# Patient Record
Sex: Female | Born: 1975
Health system: Southern US, Community
[De-identification: ages and names within clinical notes are randomized; demographics above are authoritative.]

## PROBLEM LIST (undated history)

## (undated) DIAGNOSIS — K219 Gastro-esophageal reflux disease without esophagitis: Secondary | ICD-10-CM

## (undated) DIAGNOSIS — R87629 Unspecified abnormal cytological findings in specimens from vagina: Secondary | ICD-10-CM

## (undated) DIAGNOSIS — Z8619 Personal history of other infectious and parasitic diseases: Secondary | ICD-10-CM

## (undated) HISTORY — PX: NO PAST SURGERIES: SHX2092

## (undated) HISTORY — DX: Unspecified abnormal cytological findings in specimens from vagina: R87.629

## (undated) HISTORY — DX: Gastro-esophageal reflux disease without esophagitis: K21.9

## (undated) HISTORY — DX: Personal history of other infectious and parasitic diseases: Z86.19

---

## 1999-07-03 ENCOUNTER — Other Ambulatory Visit: Admission: RE | Admit: 1999-07-03 | Discharge: 1999-07-03 | Payer: Self-pay | Admitting: *Deleted

## 2002-11-04 ENCOUNTER — Other Ambulatory Visit: Admission: RE | Admit: 2002-11-04 | Discharge: 2002-11-04 | Payer: Self-pay | Admitting: *Deleted

## 2004-04-03 ENCOUNTER — Other Ambulatory Visit: Admission: RE | Admit: 2004-04-03 | Discharge: 2004-04-03 | Payer: Self-pay | Admitting: *Deleted

## 2012-06-05 ENCOUNTER — Ambulatory Visit (INDEPENDENT_AMBULATORY_CARE_PROVIDER_SITE_OTHER): Payer: BC Managed Care – PPO | Admitting: Family Medicine

## 2012-06-05 VITALS — BP 130/80 | HR 84 | Temp 99.2°F | Resp 16 | Ht 64.0 in | Wt 160.2 lb

## 2012-06-05 DIAGNOSIS — J020 Streptococcal pharyngitis: Secondary | ICD-10-CM

## 2012-06-05 DIAGNOSIS — J029 Acute pharyngitis, unspecified: Secondary | ICD-10-CM

## 2012-06-05 MED ORDER — PENICILLIN V POTASSIUM 500 MG PO TABS: 500.0000 mg | ORAL_TABLET | Freq: Three times a day (TID) | ORAL | Status: AC

## 2012-06-05 NOTE — Progress Notes (Signed)
     Patient Name: Christina Charles Date of Birth: 19-May-1976 Medical Record Number: [id number] Gender: female Date of Encounter: 06/05/2012  History of Present Illness:  Christina Charles is a 36 y.o. very pleasant female patient who presents with the following:  She notes a ST since yesterday, pain with swallowing.  She had not noted a fever at home. She does not feel horrible, but bad enough that she wanted to come in for evaluation.  She is generally healthy.   LMP 05/15/12, is on OCP  No sick exposures but she does work at a nursing home.   No smoking history   There is no problem list on file for this patient.  No past medical history on file. No past surgical history on file. History  Substance Use Topics  . Smoking status: Never Smoker   . Smokeless tobacco: Not on file  . Alcohol Use: Not on file   No family history on file. No Known Allergies  Medication list has been reviewed and updated.  Prior to Admission medications   Medication Sig Start Date End Date Taking? Authorizing Provider  Ascorbic Acid (VITAMIN C) 100 MG tablet Take 100 mg by mouth daily.   Yes Historical Provider, MD  drospirenone-ethinyl estradiol (YASMIN,ZARAH,SYEDA) 3-0.03 MG tablet Take 1 tablet by mouth daily.   Yes Historical Provider, MD  Multiple Vitamins-Minerals (MULTIVITAMIN WITH MINERALS) tablet Take 1 tablet by mouth daily.   Yes Historical Provider, MD    Review of Systems:  As per HPI- otherwise negative.   Physical Examination: Filed Vitals:   06/05/12 1308  BP: 130/80  Pulse: 84  Temp: 99.2 F (37.3 C)  Resp: 16   Filed Vitals:   06/05/12 1308  Height: 5\' 4"  (1.626 m)  Weight: 160 lb 3.2 oz (72.666 kg)   Body mass index is 27.50 kg/(m^2). Ideal Body Weight: Weight in (lb) to have BMI = 25: 145.3   GEN: WDWN, NAD, Non-toxic, A & O x 3 HEENT: Atraumatic, Normocephalic. Neck supple. No masses, No LAD.  TM and oropharynx wnl, PEERL, EOMI Ears and Nose: No external  deformity. CV: RRR, No M/G/R. No JVD. No thrill. No extra heart sounds. PULM: CTA B, no wheezes, crackles, rhonchi. No retractions. No resp. distress. No accessory muscle use. EXTR: No c/c/e NEURO Normal gait.  PSYCH: Normally interactive. Conversant. Not depressed or anxious appearing.  Calm demeanor.   Results for orders placed in visit on 06/05/12  POCT RAPID STREP A (OFFICE)      Component Value Range   Rapid Strep A Screen Positive (*) Negative    Assessment and Plan: 1. Sore throat  POCT rapid strep A  2. Strep throat  penicillin v potassium (VEETID) 500 MG tablet   Treat for strep throat as above.  Fluids, rest, OTC pain medications as needed.  Patient (or parent if minor) instructed to return to clinic or call if not better in 2-3 day(s).  Sooner if worse.   No work for 24 hours at least   [employer], MD

## 2015-08-31 LAB — OB RESULTS CONSOLE RPR: RPR: NONREACTIVE

## 2015-08-31 LAB — OB RESULTS CONSOLE GC/CHLAMYDIA
Chlamydia: NEGATIVE
GC PROBE AMP, GENITAL: NEGATIVE

## 2015-08-31 LAB — OB RESULTS CONSOLE HIV ANTIBODY (ROUTINE TESTING): HIV: NONREACTIVE

## 2015-08-31 LAB — OB RESULTS CONSOLE ABO/RH: RH TYPE: POSITIVE

## 2015-08-31 LAB — OB RESULTS CONSOLE ANTIBODY SCREEN: Antibody Screen: NEGATIVE

## 2015-08-31 LAB — OB RESULTS CONSOLE HEPATITIS B SURFACE ANTIGEN: Hepatitis B Surface Ag: NEGATIVE

## 2015-08-31 LAB — OB RESULTS CONSOLE RUBELLA ANTIBODY, IGM: RUBELLA: IMMUNE

## 2015-10-09 ENCOUNTER — Other Ambulatory Visit (HOSPITAL_COMMUNITY): Payer: Self-pay | Admitting: Obstetrics

## 2015-10-09 DIAGNOSIS — Z3689 Encounter for other specified antenatal screening: Secondary | ICD-10-CM

## 2015-10-09 DIAGNOSIS — Z3A16 16 weeks gestation of pregnancy: Secondary | ICD-10-CM

## 2015-10-09 DIAGNOSIS — O283 Abnormal ultrasonic finding on antenatal screening of mother: Secondary | ICD-10-CM

## 2015-10-16 ENCOUNTER — Encounter (HOSPITAL_COMMUNITY): Payer: Self-pay

## 2015-10-16 ENCOUNTER — Other Ambulatory Visit (HOSPITAL_COMMUNITY): Payer: Self-pay | Admitting: Obstetrics

## 2015-10-16 ENCOUNTER — Ambulatory Visit (HOSPITAL_COMMUNITY)
Admission: RE | Admit: 2015-10-16 | Discharge: 2015-10-16 | Disposition: A | Discharge status: Home / Self Care | Payer: BLUE CROSS/BLUE SHIELD | Source: Ambulatory Visit | Attending: Obstetrics | Admitting: Obstetrics

## 2015-10-16 DIAGNOSIS — O09512 Supervision of elderly primigravida, second trimester: Secondary | ICD-10-CM | POA: Insufficient documentation

## 2015-10-16 DIAGNOSIS — Z3A16 16 weeks gestation of pregnancy: Secondary | ICD-10-CM | POA: Diagnosis not present

## 2015-10-16 DIAGNOSIS — Z3689 Encounter for other specified antenatal screening: Secondary | ICD-10-CM

## 2015-10-16 DIAGNOSIS — O283 Abnormal ultrasonic finding on antenatal screening of mother: Secondary | ICD-10-CM | POA: Insufficient documentation

## 2015-10-16 IMAGING — US US MFM OB TRANSVAGINAL
1 series · 14 of 28 positions shown · non-contrast
Comparison: none

[Series 1: us mfm ob transvaginal · 141 acquisitions, 14 frames shown]
[im 6/141]
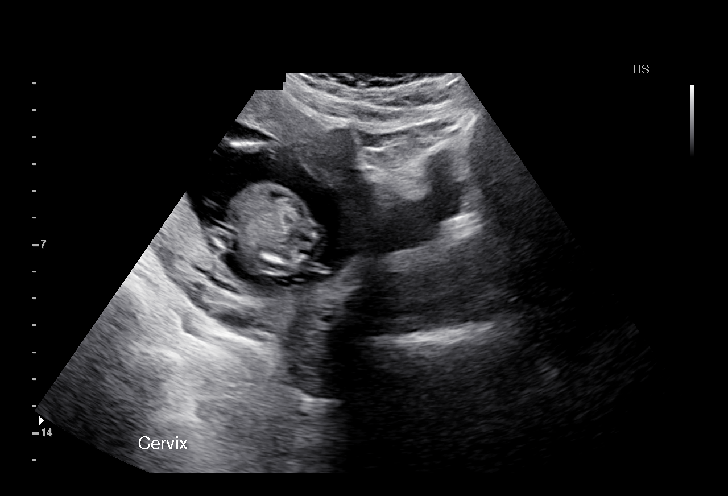
[im 16/141]
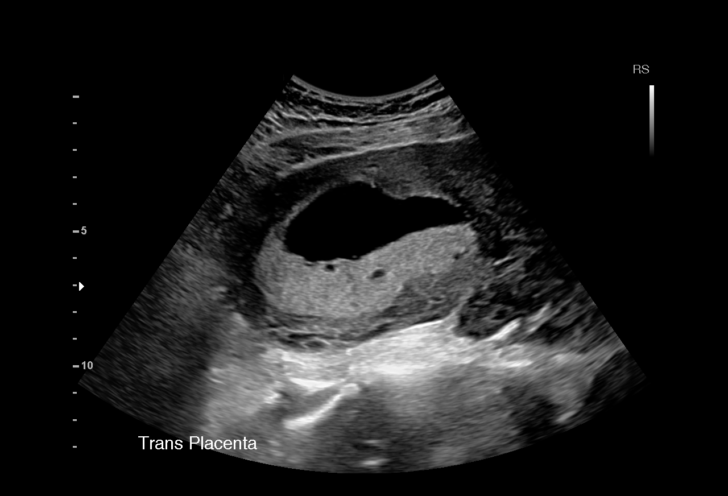
[im 26/141]
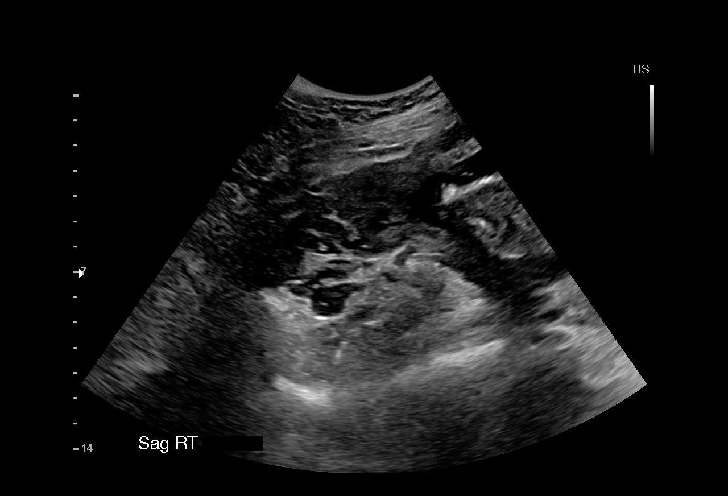
[im 37/141]
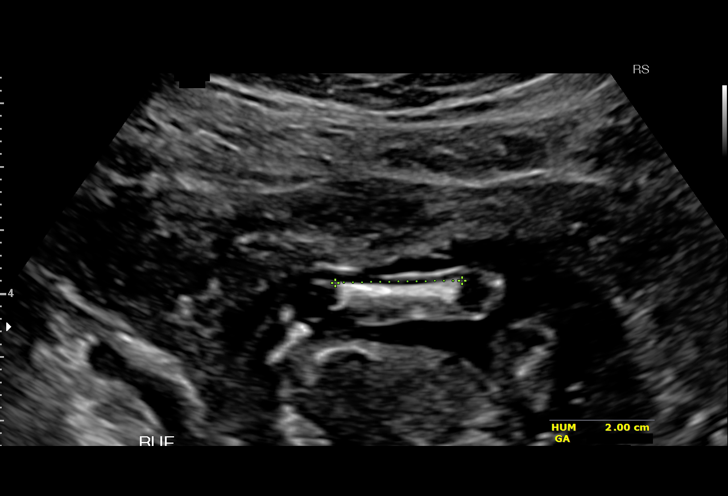
[im 47/141]
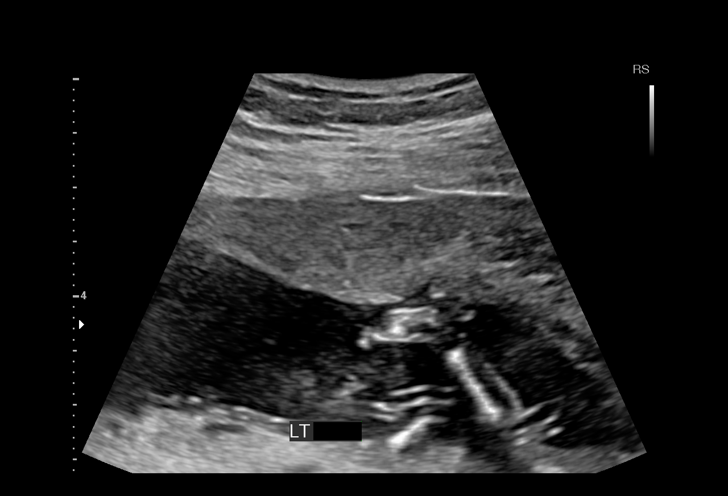
[im 58/141]
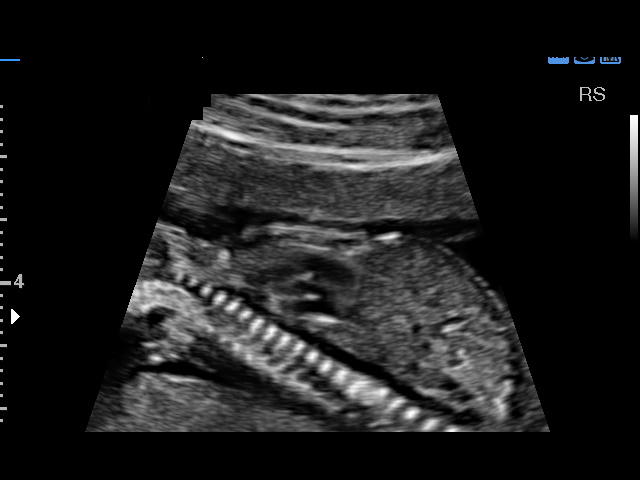
[im 68/141]
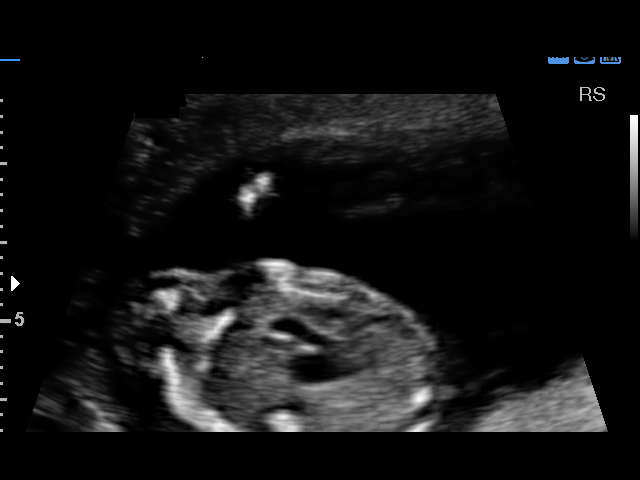
[im 78/141]
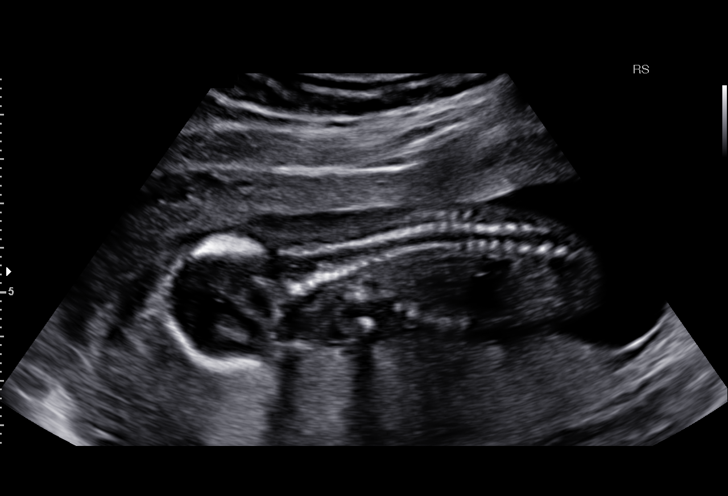
[im 89/141]
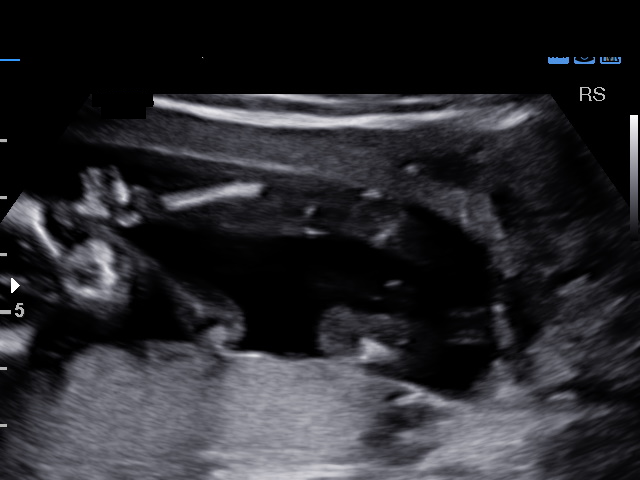
[im 99/141]
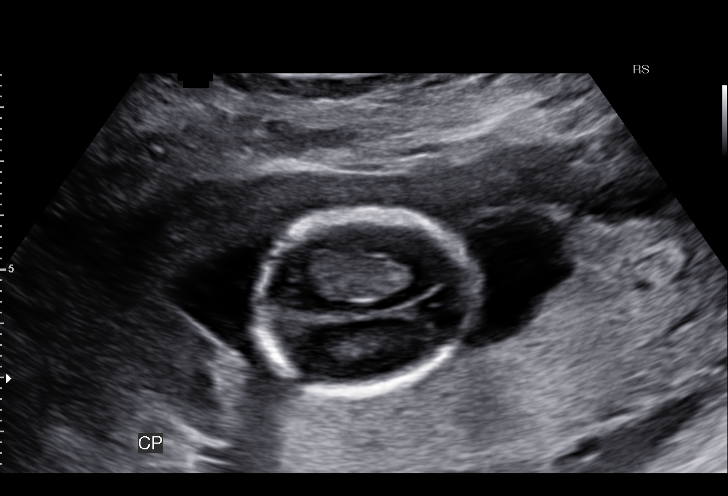
[im 109/141]
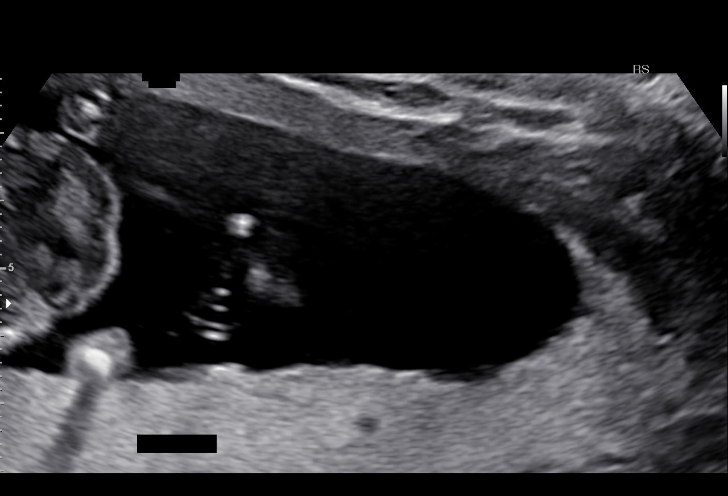
[im 120/141]
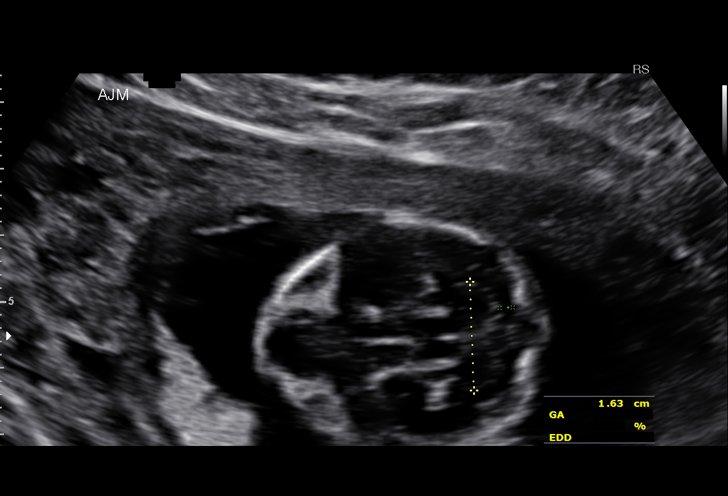
[im 130/141]
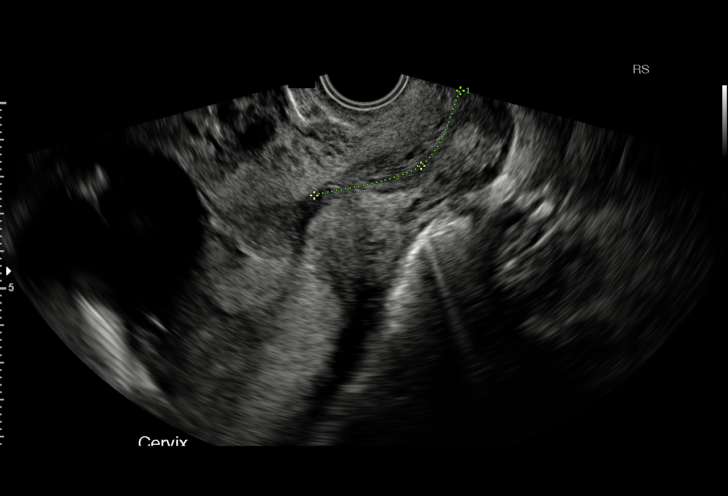
[im 141/141]
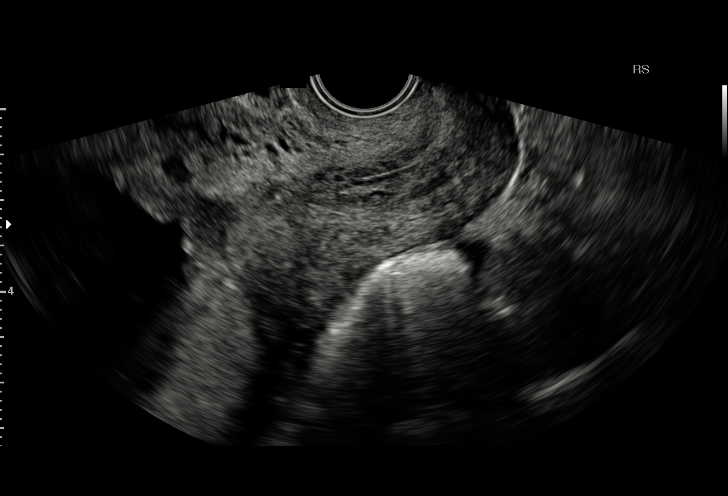

[14 of 28 positions shown; findings below may reference images not displayed]

OBSTETRICS REPORT
(Corrected Final [DATE] [DATE])

Service(s) Provided

US MFM OB TRANSVAGINAL                                76817.2
Indications

16 weeks gestation of pregnancy
Detailed fetal anatomic survey                        Z36
Advanced maternal age primigravida 35+, second        [6Q]
trimester
Abnormal fetal ultrasound, thickened NT on early      [6Q]
ultrasound
Placenta previa specified as without hemorrhage,      [6Q]
second trimester
Fetal Evaluation

Num Of Fetuses:    1
Fetal Heart Rate:  157                          bpm
Cardiac Activity:  Observed
Presentation:      Breech
Placenta:          Posterior Previa
P. Cord            Visualized
Insertion:

Amniotic Fluid
AFI FV:      Subjectively within normal limits
Larg Pckt:    3.74  cm
Biometry

BPD:     35.4  mm     G. Age:  16w 6d                CI:        81.35   70 - 86
FL/HC:      15.3   13.3 -
16.5
HC:     123.9  mm     G. Age:  16w 1d       27  %    HC/AC:      1.12   1.05 -
1.39
AC:     110.4  mm     G. Age:  16w 6d       66  %    FL/BPD:
FL:        19  mm     G. Age:  15w 4d       18  %    FL/AC:      17.2   20 - 24
HUM:     19.9  mm     G. Age:  15w 6d       39  %
CER:     16.4  mm     G. Age:  16w 3d       49  %
NFT:        3  mm

Est. FW:     152  gm      0 lb 5 oz     59  %
Gestational Age
LMP:           16w 3d        Date:  [DATE]                 EDD:   [DATE]
U/S Today:     16w 3d                                        EDD:   [DATE]
Best:          16w 3d     Det. By:  LMP  ([DATE])          EDD:   [DATE]
Anatomy

Cranium:          Appears normal         Aortic Arch:      Appears normal
Fetal Cavum:      Appears normal         Ductal Arch:      Appears normal
Ventricles:       Appears normal         Diaphragm:        Appears normal
Choroid Plexus:   Appears normal         Stomach:          Appears normal, left
sided
Cerebellum:       Appears normal         Abdomen:          Appears normal
Posterior Fossa:  Appears normal         Abdominal Wall:   Appears nml (cord
insert, abd wall)
Nuchal Fold:      Appears normal         Cord Vessels:     Appears normal (3
vessel cord)
Face:             Orbits appear          Kidneys:          Appear normal
normal
Lips:             Not well visualized    Bladder:          Appears normal
Heart:            Not well visualized    Spine:            Appears normal
RVOT:             Not well visualized    Lower             Appears normal
Extremities:
LVOT:             Appears normal         Upper             Appears normal
Extremities:

Other:  Fetus appears to be a female. Technically difficult due to fetal
position. Technically difficult due to early gestational age. Heels
visualized.
Targeted Anatomy

Fetal Central Nervous System
Lat. Ventricles:  6.3                    Cisterna Magna:
Cervix Uterus Adnexa

Cervical Length:    5.31     cm

Cervix:       Normal appearance by transvaginal scan
Uterus:       Multiple fibroids noted, see table below.
Left Ovary:    Not visualized.
Right Ovary:   Not visualized.

Adnexa:     No abnormality visualized. No adnexal mass visualized.
Myomas

Site                     L(cm)      W(cm)       D(cm)      Location
LUS
LUS

Blood Flow                  RI       PI       Comments

Impression

Single IUP at 16w 3d
Patient is referred due to thickened NT - had low risk
Panorama
Limited views of the fetal heart and face (lips) were obtained
due to earlyl gestational age and position
The remainder of the fetal anatomy appears normal
Lower uterine myomas noted as described above
Posterior placenta previa is noted
Normal amniotic fluid volume
Recommendations

Fetal echo scheduled due to thickened NT
Recommend follow up ultrasound in 4 weeks to complete
anatomy and reevaluate placental location

Attending Physician, POLANCO

## 2015-11-15 ENCOUNTER — Other Ambulatory Visit (HOSPITAL_COMMUNITY): Payer: Self-pay | Admitting: Maternal and Fetal Medicine

## 2015-11-15 ENCOUNTER — Ambulatory Visit (HOSPITAL_COMMUNITY)
Admission: RE | Admit: 2015-11-15 | Discharge: 2015-11-15 | Disposition: A | Discharge status: Home / Self Care | Payer: BLUE CROSS/BLUE SHIELD | Source: Ambulatory Visit | Attending: Obstetrics | Admitting: Obstetrics

## 2015-11-15 ENCOUNTER — Encounter (HOSPITAL_COMMUNITY): Payer: Self-pay

## 2015-11-15 DIAGNOSIS — D259 Leiomyoma of uterus, unspecified: Secondary | ICD-10-CM | POA: Insufficient documentation

## 2015-11-15 DIAGNOSIS — Z3A20 20 weeks gestation of pregnancy: Secondary | ICD-10-CM

## 2015-11-15 DIAGNOSIS — O4402 Placenta previa specified as without hemorrhage, second trimester: Secondary | ICD-10-CM

## 2015-11-15 DIAGNOSIS — O283 Abnormal ultrasonic finding on antenatal screening of mother: Secondary | ICD-10-CM | POA: Insufficient documentation

## 2015-11-15 DIAGNOSIS — O3412 Maternal care for benign tumor of corpus uteri, second trimester: Secondary | ICD-10-CM

## 2015-11-15 DIAGNOSIS — O09512 Supervision of elderly primigravida, second trimester: Secondary | ICD-10-CM

## 2015-11-15 DIAGNOSIS — Z36 Encounter for antenatal screening of mother: Secondary | ICD-10-CM | POA: Insufficient documentation

## 2015-11-17 ENCOUNTER — Encounter (HOSPITAL_COMMUNITY): Payer: Self-pay

## 2015-11-23 ENCOUNTER — Encounter (HOSPITAL_COMMUNITY): Payer: Self-pay

## 2015-12-17 NOTE — L&D Delivery Note (Signed)
Delivery Note At 7:36 PM a healthy female was delivered via Vaginal, Spontaneous Delivery (Presentation: ; Occiput Anterior).  APGAR: 9, 9; weight pending .   Placenta status: Intact, Pathology.  Cord: 3 vessels with the following complications: None.  Cord pH: N/A  Anesthesia: Epidural  Episiotomy: None Lacerations: 2nd degree;Perineal Suture Repair: 2.0 3.0 vicryl rapide Est. Blood Loss (mL):  100  Mom to postpartum.  Baby to Couplet care / Skin to Skin.  Shoshana Johal,MARIE-LYNE 03/31/2016, 8:13 PM

## 2016-03-15 LAB — OB RESULTS CONSOLE GBS: GBS: NEGATIVE

## 2016-03-27 ENCOUNTER — Other Ambulatory Visit: Payer: Self-pay | Admitting: Obstetrics

## 2016-03-27 ENCOUNTER — Telehealth (HOSPITAL_COMMUNITY): Payer: Self-pay | Admitting: *Deleted

## 2016-03-27 ENCOUNTER — Encounter (HOSPITAL_COMMUNITY): Payer: Self-pay | Admitting: *Deleted

## 2016-03-27 NOTE — Telephone Encounter (Signed)
Preadmission screen  

## 2016-03-31 ENCOUNTER — Encounter (HOSPITAL_COMMUNITY): Payer: Self-pay | Admitting: *Deleted

## 2016-03-31 ENCOUNTER — Inpatient Hospital Stay (HOSPITAL_COMMUNITY): Payer: Federal, State, Local not specified - PPO | Admitting: Anesthesiology

## 2016-03-31 ENCOUNTER — Inpatient Hospital Stay (HOSPITAL_COMMUNITY)
Admission: AD | Admit: 2016-03-31 | Discharge: 2016-04-02 | DRG: 775 | Disposition: A | Discharge status: Home / Self Care | Payer: Federal, State, Local not specified - PPO | Source: Ambulatory Visit | Attending: Obstetrics & Gynecology | Admitting: Obstetrics & Gynecology

## 2016-03-31 DIAGNOSIS — O4202 Full-term premature rupture of membranes, onset of labor within 24 hours of rupture: Secondary | ICD-10-CM | POA: Diagnosis present

## 2016-03-31 DIAGNOSIS — O9962 Diseases of the digestive system complicating childbirth: Secondary | ICD-10-CM | POA: Diagnosis present

## 2016-03-31 DIAGNOSIS — Z3A40 40 weeks gestation of pregnancy: Secondary | ICD-10-CM

## 2016-03-31 DIAGNOSIS — O3413 Maternal care for benign tumor of corpus uteri, third trimester: Secondary | ICD-10-CM | POA: Diagnosis present

## 2016-03-31 DIAGNOSIS — O9902 Anemia complicating childbirth: Secondary | ICD-10-CM | POA: Diagnosis present

## 2016-03-31 DIAGNOSIS — D649 Anemia, unspecified: Secondary | ICD-10-CM | POA: Diagnosis present

## 2016-03-31 DIAGNOSIS — O3663X0 Maternal care for excessive fetal growth, third trimester, not applicable or unspecified: Secondary | ICD-10-CM | POA: Diagnosis present

## 2016-03-31 DIAGNOSIS — K219 Gastro-esophageal reflux disease without esophagitis: Secondary | ICD-10-CM | POA: Diagnosis present

## 2016-03-31 DIAGNOSIS — D259 Leiomyoma of uterus, unspecified: Secondary | ICD-10-CM | POA: Diagnosis present

## 2016-03-31 LAB — ABO/RH: ABO/RH(D): AB POS

## 2016-03-31 LAB — CBC
HCT: 35.4 % — ABNORMAL LOW (ref 36.0–46.0)
Hemoglobin: 12.5 g/dL (ref 12.0–15.0)
MCH: 30.7 pg (ref 26.0–34.0)
MCHC: 35.3 g/dL (ref 30.0–36.0)
MCV: 87 fL (ref 78.0–100.0)
PLATELETS: 254 10*3/uL (ref 150–400)
RBC: 4.07 MIL/uL (ref 3.87–5.11)
RDW: 13.5 % (ref 11.5–15.5)
WBC: 11 10*3/uL — AB (ref 4.0–10.5)

## 2016-03-31 LAB — POCT FERN TEST: POCT Fern Test: POSITIVE

## 2016-03-31 LAB — TYPE AND SCREEN
ABO/RH(D): AB POS
Antibody Screen: NEGATIVE

## 2016-03-31 MED ORDER — PHENYLEPHRINE 40 MCG/ML (10ML) SYRINGE FOR IV PUSH (FOR BLOOD PRESSURE SUPPORT)
80.0000 ug | PREFILLED_SYRINGE | INTRAVENOUS | Status: DC | PRN
  Administered 2016-03-31: 80 ug via INTRAVENOUS
  Filled 2016-03-31: qty 20
  Filled 2016-03-31: qty 2

## 2016-03-31 MED ORDER — LACTATED RINGERS IV SOLN
INTRAVENOUS | Status: DC
  Administered 2016-03-31 (×2): via INTRAVENOUS

## 2016-03-31 MED ORDER — LIDOCAINE HCL (PF) 1 % IJ SOLN
INTRAMUSCULAR | Status: DC | PRN
  Administered 2016-03-31 (×2): 6 mL

## 2016-03-31 MED ORDER — FENTANYL 2.5 MCG/ML BUPIVACAINE 1/10 % EPIDURAL INFUSION (WH - ANES)
14.0000 mL/h | INTRAMUSCULAR | Status: DC | PRN
  Administered 2016-03-31: 14 mL/h via EPIDURAL
  Filled 2016-03-31: qty 125

## 2016-03-31 MED ORDER — DIPHENHYDRAMINE HCL 50 MG/ML IJ SOLN: 12.5000 mg | INTRAMUSCULAR | Status: DC | PRN

## 2016-03-31 MED ORDER — LACTATED RINGERS IV SOLN
500.0000 mL | INTRAVENOUS | Status: DC | PRN
  Administered 2016-03-31: 500 mL via INTRAVENOUS

## 2016-03-31 MED ORDER — CITRIC ACID-SODIUM CITRATE 334-500 MG/5ML PO SOLN: 30.0000 mL | ORAL | Status: DC | PRN

## 2016-03-31 MED ORDER — LACTATED RINGERS IV SOLN
1.0000 m[IU]/min | INTRAVENOUS | Status: DC
  Administered 2016-03-31: 2 m[IU]/min via INTRAVENOUS
  Filled 2016-03-31: qty 4

## 2016-03-31 MED ORDER — EPHEDRINE 5 MG/ML INJ
10.0000 mg | INTRAVENOUS | Status: DC | PRN
  Filled 2016-03-31: qty 2

## 2016-03-31 MED ORDER — TERBUTALINE SULFATE 1 MG/ML IJ SOLN
0.2500 mg | Freq: Once | INTRAMUSCULAR | Status: DC | PRN
  Filled 2016-03-31: qty 1

## 2016-03-31 MED ORDER — LACTATED RINGERS IV SOLN
500.0000 mL | Freq: Once | INTRAVENOUS | Status: DC
Start: 2016-03-31 — End: 2016-04-01

## 2016-03-31 MED ORDER — ONDANSETRON HCL 4 MG/2ML IJ SOLN: 4.0000 mg | Freq: Four times a day (QID) | INTRAMUSCULAR | Status: DC | PRN

## 2016-03-31 MED ORDER — OXYTOCIN BOLUS FROM INFUSION: 500.0000 mL | INTRAVENOUS | Status: DC

## 2016-03-31 MED ORDER — PHENYLEPHRINE 40 MCG/ML (10ML) SYRINGE FOR IV PUSH (FOR BLOOD PRESSURE SUPPORT)
80.0000 ug | PREFILLED_SYRINGE | INTRAVENOUS | Status: DC | PRN
  Filled 2016-03-31: qty 2

## 2016-03-31 MED ORDER — OXYCODONE-ACETAMINOPHEN 5-325 MG PO TABS: 2.0000 | ORAL_TABLET | ORAL | Status: DC | PRN

## 2016-03-31 MED ORDER — ACETAMINOPHEN 325 MG PO TABS
650.0000 mg | ORAL_TABLET | ORAL | Status: DC | PRN
  Administered 2016-03-31: 650 mg via ORAL
  Filled 2016-03-31: qty 2

## 2016-03-31 MED ORDER — LIDOCAINE HCL (PF) 1 % IJ SOLN
30.0000 mL | INTRAMUSCULAR | Status: DC | PRN
  Administered 2016-03-31: 30 mL via SUBCUTANEOUS
  Filled 2016-03-31: qty 30

## 2016-03-31 MED ORDER — OXYCODONE-ACETAMINOPHEN 5-325 MG PO TABS: 1.0000 | ORAL_TABLET | ORAL | Status: DC | PRN

## 2016-03-31 MED ORDER — OXYTOCIN 10 UNIT/ML IJ SOLN: 2.5000 [IU]/h | INTRAMUSCULAR | Status: DC

## 2016-03-31 NOTE — Anesthesia Procedure Notes (Signed)
Epidural Patient location during procedure: OB  Staffing Anesthesiologist: Franne Grip  Preanesthetic Checklist Completed: patient identified, site marked, surgical consent, pre-op evaluation, timeout performed, IV checked, risks and benefits discussed and monitors and equipment checked  Epidural Patient position: sitting Prep: DuraPrep Patient monitoring: heart rate and blood pressure Approach: midline Location: L3-L4 Injection technique: LOR air  Needle:  Needle type: Tuohy  Needle gauge: 17 G Needle insertion depth: 5 cm Catheter type: closed end flexible Catheter size: 19 Gauge Catheter at skin depth: 11 cm Test dose: negative and Other  Assessment Events: blood not aspirated, injection not painful, no injection resistance, negative IV test and no paresthesia  Additional Notes Reason for block:procedure for pain

## 2016-03-31 NOTE — Anesthesia Preprocedure Evaluation (Addendum)
Anesthesia Evaluation  Patient identified by MRN, date of birth, ID band Patient awake    Reviewed: Allergy & Precautions, NPO status , Patient's Chart, lab work & pertinent test results  Airway Mallampati: II  TM Distance: >3 FB Neck ROM: Full    Dental no notable dental hx.    Pulmonary neg pulmonary ROS,    Pulmonary exam normal breath sounds clear to auscultation       Cardiovascular negative cardio ROS Normal cardiovascular exam Rhythm:Regular Rate:Normal     Neuro/Psych negative neurological ROS  negative psych ROS   GI/Hepatic Neg liver ROS, GERD  Medicated,  Endo/Other  negative endocrine ROS  Renal/GU negative Renal ROS  negative genitourinary   Musculoskeletal negative musculoskeletal ROS (+)   Abdominal (+) + obese,   Peds negative pediatric ROS (+)  Hematology negative hematology ROS (+)   Anesthesia Other Findings   Reproductive/Obstetrics negative OB ROS                             Anesthesia Physical Anesthesia Plan  ASA: II  Anesthesia Plan: Epidural   Post-op Pain Management:    Induction: Intravenous  Airway Management Planned: Natural Airway  Additional Equipment:   Intra-op Plan:   Post-operative Plan:   Informed Consent: I have reviewed the patients History and Physical, chart, labs and discussed the procedure including the risks, benefits and alternatives for the proposed anesthesia with the patient or authorized representative who has indicated his/her understanding and acceptance.   Dental advisory given  Plan Discussed with: CRNA  Anesthesia Plan Comments: (Informed consent obtained prior to proceeding including risk of failure, 1% risk of PDPH, risk of minor discomfort and bruising.  Discussed rare but serious complications including epidural abscess, permanent nerve injury, epidural hematoma.  Discussed alternatives to epidural analgesia and  patient desires to proceed.  Timeout performed pre-procedure verifying patient name, procedure, and platelet count.  Patient tolerated procedure well. )        Anesthesia Quick Evaluation

## 2016-03-31 NOTE — Progress Notes (Signed)
Christina Charles is a 40 y.o. G1P0 at [redacted]w[redacted]d admitted for rupture of membranes, 2.5 cm at admission. Has fibroid uterus.  Objective: BP 104/59 mmHg  Pulse 100  Temp(Src) 98.4 F (36.9 C) (Oral)  Resp 20  Ht 5\' 4"  (1.626 m)  Wt 195 lb (88.451 kg)  BMI 33.46 kg/m2  SpO2 100%  LMP 06/23/2015   FHT:  FHR: 145 bpm, variability: moderate,  accelerations:  Present,  decelerations:  Absent UC:   regular, every 2-3 minutes SVE:   Complete/ 0 / LOT, in lateral position with peanut ball to rotate and descent.   Progressed well on pitocin, FHT in category I. Epidural working well. EFW 8.1/2 lbs.  Reassess descent every 30 min, RN to check at 5.30 pm and inform Dr Dellis Filbert.  Pt informed of call switch with Dr Dellis Filbert at 5 pm, understands and is ok.   Christina Charles R 03/31/2016, 5:14 PM

## 2016-03-31 NOTE — MAU Note (Addendum)
Leaking fld since 0530. Some mild contractions. Cont to leak fld since 0530. 3cm last sve

## 2016-03-31 NOTE — Progress Notes (Signed)
Arisbeth Daube is a 40 y.o. G1P0 at [redacted]w[redacted]d by ultrasound admitted for rupture of membranes, pitocin since admission at 9 am and progressing, anticipate active change now. S/p epidural.   Objective: BP 135/82 mmHg  Pulse 103  Temp(Src) 98.6 F (37 C) (Oral)  Resp 18  Ht 5\' 4"  (1.626 m)  Wt 195 lb (88.451 kg)  BMI 33.46 kg/m2  SpO2 100%  LMP 06/23/2015   FHT:  FHR: 145 bpm, variability: moderate,  accelerations:  Present,  decelerations:  Absent UC:   regular, every 2-3 minutes SVE:   Dilation: 9 Effacement (%): 100 Station: 0 Exam by:: patti moore rn  Assessment / Plan: Augmentation of labor, progressing well  Labor: Progressing normally Fetal Wellbeing:  Category I Pain Control:  Epidural I/D:  n/a Anticipated MOD:  NSVD  Sharese Manrique R 03/31/2016, 3:00 PM

## 2016-03-31 NOTE — H&P (Signed)
Christina Charles is a 40 y.o. female presenting at 40.2 wks in early labor with SROM at 5.30 am. Denies strong UCs or bleeding. P[facility]- Dr Pamala Hurry, from 1st trim. Abn NT at 14 wks, normal with MFM at 16 wks and at anatomy sono, normal Panorama and also normal fetal echo  Fibroid uterus, suspected LGA at 38 wk sono (8'5" 91% and AC 99%), GERD, Anemia.   History OB History    Gravida Para Term Preterm AB TAB SAB Ectopic Multiple Living   1              Past Medical History  Diagnosis Date  . Vaginal Pap smear, abnormal   . GERD (gastroesophageal reflux disease)   . Hx of varicella    Past Surgical History  Procedure Laterality Date  . No past surgeries     Family History: family history includes Cancer in her mother and paternal grandmother. Social History:  reports that she has never smoked. She has never used smokeless tobacco. She reports that she does not drink alcohol or use illicit drugs.   Prenatal Transfer Tool  Maternal Diabetes: No Genetic Screening: Normal Maternal Ultrasounds/Referrals: Abnormal:  Findings:   Other: Abn NT at 14 wks, normal with MFM at 16 wks and at anatomy sono, normal Panorama and also normal fetal echo   Fetal Ultrasounds or other Referrals:  Fetal echo- normal , MFM did Anatomy- normal  Maternal Substance Abuse:  No Significant Maternal Medications:  None Significant Maternal Lab Results:  Lab values include: Group B Strep negative Other Comments:  None  ROS Blood pressure 134/81, pulse 97, temperature 98.6 F (37 C), temperature source Oral, resp. rate 18, height 5\' 4"  (1.626 m), weight 195 lb (88.451 kg), last menstrual period 06/23/2015, SpO2 100 %. Exam Physical Exam   A&O x 3, no acute distress. Pleasant HEENT neg, no thyromegaly Lungs CTA bilat CV RRR, A1S2 normal Abdo soft, non tender, non acute Extr no edema/ tenderness Pelvic 2/50%/-3 per MAU RN, repeat check deferred since ROM.  FHT  140/ + accels/ no decels/ mod variab-  category I  Toco q 3-4 min, on pitocin since admission   Prenatal labs: ABO, Rh: --/--/AB POS, AB POS (04/16 0800) Antibody: NEG (04/16 0800) Rubella: Immune (09/15 0000) RPR: Nonreactive (09/15 0000)  HBsAg: Negative (09/15 0000)  HIV: Non-reactive (09/15 0000)  GBS: Negative (03/31 0000)    Assessment/Plan: 40 yo G1 at 40.2 wks, SROM and early labor. GBS(-). Fibroid uterus and suspect LGA at 8.1/2 -9 lbs. Watch progress, epidural ok. Reassess in active labor. Prepare for shoulder dystocia and also for pp h'hage due to fibroids.     Christina Charles R 03/31/2016, 2:47 PM

## 2016-04-01 ENCOUNTER — Encounter (HOSPITAL_COMMUNITY): Payer: Self-pay | Admitting: *Deleted

## 2016-04-01 LAB — CBC
HCT: 30.9 % — ABNORMAL LOW (ref 36.0–46.0)
Hemoglobin: 10.7 g/dL — ABNORMAL LOW (ref 12.0–15.0)
MCH: 30.4 pg (ref 26.0–34.0)
MCHC: 34.6 g/dL (ref 30.0–36.0)
MCV: 87.8 fL (ref 78.0–100.0)
PLATELETS: 221 10*3/uL (ref 150–400)
RBC: 3.52 MIL/uL — AB (ref 3.87–5.11)
RDW: 13.7 % (ref 11.5–15.5)
WBC: 13.3 10*3/uL — ABNORMAL HIGH (ref 4.0–10.5)

## 2016-04-01 LAB — RPR: RPR Ser Ql: NONREACTIVE

## 2016-04-01 MED ORDER — ONDANSETRON HCL 4 MG/2ML IJ SOLN: 4.0000 mg | INTRAMUSCULAR | Status: DC | PRN

## 2016-04-01 MED ORDER — TETANUS-DIPHTH-ACELL PERTUSSIS 5-2.5-18.5 LF-MCG/0.5 IM SUSP: 0.5000 mL | Freq: Once | INTRAMUSCULAR | Status: DC

## 2016-04-01 MED ORDER — IBUPROFEN 600 MG PO TABS
600.0000 mg | ORAL_TABLET | Freq: Four times a day (QID) | ORAL | Status: DC
  Administered 2016-04-01 – 2016-04-02 (×6): 600 mg via ORAL
  Filled 2016-04-01 (×7): qty 1

## 2016-04-01 MED ORDER — WITCH HAZEL-GLYCERIN EX PADS: 1.0000 "application " | MEDICATED_PAD | CUTANEOUS | Status: DC | PRN

## 2016-04-01 MED ORDER — ZOLPIDEM TARTRATE 5 MG PO TABS
5.0000 mg | ORAL_TABLET | Freq: Every evening | ORAL | Status: DC | PRN
Start: 2016-04-01 — End: 2016-04-01

## 2016-04-01 MED ORDER — DIBUCAINE 1 % RE OINT: 1.0000 "application " | TOPICAL_OINTMENT | RECTAL | Status: DC | PRN

## 2016-04-01 MED ORDER — FAMOTIDINE 20 MG PO TABS
20.0000 mg | ORAL_TABLET | Freq: Two times a day (BID) | ORAL | Status: DC
  Administered 2016-04-01 – 2016-04-02 (×3): 20 mg via ORAL
  Filled 2016-04-01 (×3): qty 1

## 2016-04-01 MED ORDER — OXYTOCIN 10 UNIT/ML IJ SOLN: 2.5000 [IU]/h | INTRAVENOUS | Status: DC | PRN

## 2016-04-01 MED ORDER — SENNOSIDES-DOCUSATE SODIUM 8.6-50 MG PO TABS
2.0000 | ORAL_TABLET | ORAL | Status: DC
  Administered 2016-04-01: 2 via ORAL
  Filled 2016-04-01 (×2): qty 2

## 2016-04-01 MED ORDER — KETOTIFEN FUMARATE 0.025 % OP SOLN: 1.0000 [drp] | Freq: Two times a day (BID) | OPHTHALMIC | Status: DC

## 2016-04-01 MED ORDER — BENZOCAINE-MENTHOL 20-0.5 % EX AERO
1.0000 "application " | INHALATION_SPRAY | CUTANEOUS | Status: DC | PRN
  Administered 2016-04-01: 1 via TOPICAL
  Filled 2016-04-01: qty 56

## 2016-04-01 MED ORDER — ACETAMINOPHEN 325 MG PO TABS
650.0000 mg | ORAL_TABLET | ORAL | Status: DC | PRN
  Administered 2016-04-01: 650 mg via ORAL
  Filled 2016-04-01: qty 2

## 2016-04-01 MED ORDER — SIMETHICONE 80 MG PO CHEW: 80.0000 mg | CHEWABLE_TABLET | ORAL | Status: DC | PRN

## 2016-04-01 MED ORDER — ONDANSETRON HCL 4 MG PO TABS: 4.0000 mg | ORAL_TABLET | ORAL | Status: DC | PRN

## 2016-04-01 MED ORDER — DIPHENHYDRAMINE HCL 25 MG PO CAPS: 25.0000 mg | ORAL_CAPSULE | Freq: Four times a day (QID) | ORAL | Status: DC | PRN

## 2016-04-01 MED ORDER — PRENATAL MULTIVITAMIN CH
1.0000 | ORAL_TABLET | Freq: Every day | ORAL | Status: DC
  Administered 2016-04-01 – 2016-04-02 (×2): 1 via ORAL
  Filled 2016-04-01 (×2): qty 1

## 2016-04-01 MED ORDER — COCONUT OIL OIL: 1.0000 "application " | TOPICAL_OIL | Status: DC | PRN

## 2016-04-01 NOTE — Lactation Note (Signed)
This note was copied from a baby's chart. Lactation Consultation Note New mom called to rm. D/t  Painful latches. Mom has large everted nipples w/purple around edges of nipples. Assisted in football position. Discussed positioning, body alignment. In football position. Discussed open flange and chin tug. Baby has tight upper labial frenulum. Able to protrude tongue past gum line. Latched w/baby w/o difficulty. Has good up mouth to obtain flange. Mom stated lightly tender but pain not as bad.  Patient Name: Christina Charles [id number] Date: 04/01/2016 Reason for consult: Follow-up assessment;Breast/nipple pain;Difficult latch   Maternal Data    Feeding Feeding Type: Breast Fed Length of feed: 35 min  LATCH Score/Interventions Latch: Repeated attempts needed to sustain latch, nipple held in mouth throughout feeding, stimulation needed to elicit sucking reflex. Intervention(s): Adjust position;Assist with latch;Breast compression;Breast massage  Audible Swallowing: A few with stimulation Intervention(s): Skin to skin;Hand expression  Type of Nipple: Everted at rest and after stimulation  Comfort (Breast/Nipple): Filling, red/small blisters or bruises, mild/mod discomfort  Problem noted: Mild/Moderate discomfort Interventions (Mild/moderate discomfort): Hand massage;Hand expression  Hold (Positioning): Assistance needed to correctly position infant at breast and maintain latch. Intervention(s): Breastfeeding basics reviewed;Support Pillows;Position options;Skin to skin  LATCH Score: 6  Lactation Tools Discussed/Used     Consult Status Consult Status: Follow-up Date: 04/01/16 (in pm) Follow-up type: In-patient    Theodoro Kalata 04/01/2016, 4:40 AM

## 2016-04-01 NOTE — Anesthesia Postprocedure Evaluation (Signed)
Anesthesia Post Note  Patient: Christina Charles  Procedure(s) Performed: * No procedures listed *  Patient location during evaluation: Mother Baby Anesthesia Type: Epidural Level of consciousness: awake and alert and oriented Pain management: pain level controlled Vital Signs Assessment: post-procedure vital signs reviewed and stable Respiratory status: respiratory function stable, nonlabored ventilation and spontaneous breathing Cardiovascular status: stable Postop Assessment: no headache, no backache, patient able to bend at knees, no signs of nausea or vomiting and adequate PO intake Anesthetic complications: no    Last Vitals:  Filed Vitals:   03/31/16 2220 03/31/16 2300  BP: 125/80 125/80  Pulse: 89 89  Temp: 37.4 C 37.4 C  Resp: 18 18    Last Pain:  Filed Vitals:   04/01/16 0019  PainSc: 4                  Vittoria Noreen

## 2016-04-01 NOTE — Progress Notes (Signed)
PPD 1 SVD with 2nd degree repair  S:  Reports feeling ok just sore              Tolerating po/ No nausea or vomiting             Bleeding is moderate             Pain controlled with motrin and tylenol             Up ad lib / ambulatory / voiding QS  Newborn breast feeding  O:               VS: BP 125/80 mmHg  Pulse 89  Temp(Src) 99.3 F (37.4 C) (Oral)  Resp 18  Ht 5\' 4"  (1.626 m)  Wt 88.451 kg (195 lb)  BMI 33.46 kg/m2  SpO2 100%  LMP 06/23/2015   LABS:              Recent Labs  03/31/16 0800 04/01/16 0516  WBC 11.0* 13.3*  HGB 12.5 10.7*  PLT 254 221               Blood type: --/--/AB POS, AB POS (04/16 0800)  Rubella: Immune (09/15 0000)                     I&O: Intake/Output      04/16 0701 - 04/17 0700 04/17 0701 - 04/18 0700   Urine (mL/kg/hr) 300 (0.1)    Blood 150 (0.1)    Total Output 450     Net -450                      Physical Exam:             Alert and oriented X3  Abdomen: soft, non-tender, non-distended              Fundus: firm, non-tender, U-1  Perineum: moderate edema / ice pack in place  Lochia: light  Extremities: no edema, no calf pain or tenderness    A: PPD # 1   Doing well - stable status  P: Routine post partum orders  DC tomorrow  Artelia Laroche CNM, MSN, [facility] 04/01/2016, 7:53 AM

## 2016-04-01 NOTE — Lactation Note (Signed)
This note was copied from a baby's chart. Lactation Consultation Note  Follow up visit made.  Mom c/o sore nipples.  Baby is currently latched in football hold and latch appears deep with lips flanged but mom uncomfortable.  Baby taken off breast and small abrasions noted on tip of nipple and nipple slightly pinched.  Mom has large, erect nipples.  Assisted mom with side lying position.  Baby opened wide and easily latched deeply to breast.  Mom states this is much more comfortable.  Instructed on breast massage during feeding.  Comfort gels given with instructions.  Encouraged to call with concerns/assist. Prn.  Patient Name: Christina Charles [id number] Date: 04/01/2016 Reason for consult: Follow-up assessment;Breast/nipple pain   Maternal Data    Feeding Feeding Type: Breast Fed Length of feed: 15 min  LATCH Score/Interventions Latch: Grasps breast easily, tongue down, lips flanged, rhythmical sucking. Intervention(s): Adjust position;Assist with latch;Breast massage;Breast compression  Audible Swallowing: A few with stimulation Intervention(s): Skin to skin Intervention(s): Alternate breast massage;Hand expression  Type of Nipple: Everted at rest and after stimulation  Comfort (Breast/Nipple): Filling, red/small blisters or bruises, mild/mod discomfort  Problem noted: Mild/Moderate discomfort Interventions (Mild/moderate discomfort): Comfort gels  Hold (Positioning): Assistance needed to correctly position infant at breast and maintain latch. Intervention(s): Support Pillows;Position options;Skin to skin  LATCH Score: 7  Lactation Tools Discussed/Used     Consult Status Consult Status: Follow-up Date: 04/02/16 Follow-up type: In-patient    [name] 04/01/2016, 8:45 AM

## 2016-04-02 MED ORDER — ACETAMINOPHEN 500 MG PO TABS: 500.0000 mg | ORAL_TABLET | Freq: Four times a day (QID) | ORAL | Status: DC | PRN

## 2016-04-02 MED ORDER — IBUPROFEN 200 MG PO TABS: 600.0000 mg | ORAL_TABLET | Freq: Four times a day (QID) | ORAL | Status: DC | PRN

## 2016-04-02 NOTE — Lactation Note (Signed)
This note was copied from a baby's chart. Lactation Consultation Note; Mother states that her nipples feel much better today. She is using the comfort gels.Mother is not using a nipple shield.. She is latching infant to the bare breast.  Mother had just finished a 30 mins feeding. She was attempting to latch infant on the alternate breast when I arrived in the room. Mother advised in treatment to prevent engorgement . She was also informed to drain breast well if become engorged. Mother is aware of available [place] services and community support.   Patient Name: Christina Charles [id number] Date: 04/02/2016 Reason for consult: Follow-up assessment   Maternal Data    Feeding Feeding Type: Breast Fed Length of feed: 30 min (per mom)  LATCH Score/Interventions                      Lactation Tools Discussed/Used     Consult Status Consult Status: Complete    Darla Lesches 04/02/2016, 10:39 AM

## 2016-04-02 NOTE — Discharge Summary (Signed)
OB Discharge Summary     Patient Name: Christina Charles DOB: 06/16/1976 MRN: [id number]  Date of admission: 03/31/2016 Delivering MD: Princess Bruins   Date of discharge: 04/12/2016  Admitting diagnosis: 40WKS WATER BROKE  Intrauterine pregnancy: [redacted]w[redacted]d     Secondary diagnosis:  Principal Problem:   Postpartum care following vaginal delivery (4/16) Active Problems:   SVD (spontaneous vaginal delivery)  Additional problems: none     Discharge diagnosis: Term Pregnancy Delivered                                                                                                Post partum procedures:none  Augmentation: Pitocin  Complications: None  Hospital course:  Onset of Labor With Vaginal Delivery     40 y.o. yo G1P1001 at [redacted]w[redacted]d was admitted in Latent Labor on 03/31/2016. Patient had an uncomplicated labor course as follows:  Membrane Rupture Time/Date: 5:30 AM ,03/31/2016   Intrapartum Procedures: Episiotomy: None [1]                                         Lacerations:  2nd degree [3];Perineal [11]  Patient had a delivery of a Viable infant. 03/31/2016  Information for the patient's newborn:  Kandee, Stetzel [id number]  Delivery Method: Vaginal, Spontaneous Delivery (Filed from Delivery Summary)    Pateint had an uncomplicated postpartum course.  She is ambulating, tolerating a regular diet, passing flatus, and urinating well. Patient is discharged home in stable condition on 04/12/2016.    Physical exam  Filed Vitals:   03/31/16 2300 04/01/16 0815 04/01/16 1821 04/02/16 0534  BP: 125/80 111/74 119/77 120/73  Pulse: 89 82 79 83  Temp: 99.3 F (37.4 C) 97.9 F (36.6 C) 98.2 F (36.8 C) 98 F (36.7 C)  TempSrc: Oral Oral Oral Oral  Resp: 18 20    Height:      Weight:      SpO2:  98%  99%   General: alert, cooperative and no distress Lochia: appropriate Uterine Fundus: firm DVT Evaluation: No evidence of DVT seen on physical exam. Negative Homan's  sign. No cords or calf tenderness. No significant calf/ankle edema. Labs: Lab Results  Component Value Date   WBC 13.3* 04/01/2016   HGB 10.7* 04/01/2016   HCT 30.9* 04/01/2016   MCV 87.8 04/01/2016   PLT 221 04/01/2016   No flowsheet data found.  Discharge instruction: per After Visit Summary and "Baby and Me Booklet".  After visit meds:    Medication List    TAKE these medications        acetaminophen 500 MG tablet  Commonly known as:  TYLENOL  Take 1 tablet (500 mg total) by mouth every 6 (six) hours as needed.     ibuprofen 200 MG tablet  Commonly known as:  ADVIL  Take 3 tablets (600 mg total) by mouth every 6 (six) hours as needed.     ketotifen 0.025 % ophthalmic solution  Commonly known as:  ZADITOR  Place 1  drop into both eyes 2 (two) times daily.     PRENATAL VITAMIN PO  Take 1 tablet by mouth daily.     ranitidine 150 MG tablet  Commonly known as:  ZANTAC  Take 150 mg by mouth 2 (two) times daily.        Diet: routine diet  Activity: Advance as tolerated. Pelvic rest for 6 weeks.   Outpatient follow up:6 weeks Follow up Appt:No future appointments. Follow up Visit:No Follow-up on file.  Postpartum contraception: Undecided  Newborn Data: Live born female on 03/31/2016 Birth Weight: 8 lb 1.6 oz (3674 g) APGAR: 9, 9  Baby Feeding: Breast Disposition:home with mother   04/12/2016 Laury Deep, Jerilynn Mages, CNM

## 2016-04-02 NOTE — Progress Notes (Signed)
PPD 2 SVD with 2nd degree repair, GIRL.   S:  Reports feeling ok just sore              Tolerating po/ No nausea or vomiting             Bleeding is moderate             Pain controlled with motrin and tylenol             Up ad lib / ambulatory / voiding QS  Newborn breast feeding  O:               VS: BP 120/73 mmHg  Pulse 83  Temp(Src) 98 F (36.7 C) (Oral)  Resp 20  Ht 5\' 4"  (1.626 m)  Wt 195 lb (88.451 kg)  BMI 33.46 kg/m2  SpO2 99%  LMP 06/23/2015  Breastfeeding? Unknown   LABS:               Recent Labs  03/31/16 0800 04/01/16 0516  WBC 11.0* 13.3*  HGB 12.5 10.7*  PLT 254 221               Blood type: --/--/AB POS, AB POS (04/16 0800)  Rubella: Immune (09/15 0000)                     I&O: Intake/Output      04/17 0701 - 04/18 0700 04/18 0701 - 04/19 0700   Urine (mL/kg/hr)     Blood     Total Output       Net                        Physical Exam:             Alert and oriented X3  Abdomen: soft, non-tender, non-distended              Fundus: firm, non-tender, U-1  Perineum: moderate edema / ice pack in place  Lochia: light  Extremities: no edema, no calf pain or tenderness    A: PPD # 2   Doing well - stable status  P: Routine post partum orders  DC today  Amorina Doerr R   04/02/2016, 11:48 AM

## 2016-04-02 NOTE — Discharge Instructions (Signed)

## 2016-04-03 ENCOUNTER — Inpatient Hospital Stay (HOSPITAL_COMMUNITY): Admission: RE | Admit: 2016-04-03 | Payer: BLUE CROSS/BLUE SHIELD | Source: Ambulatory Visit

## 2016-04-03 ENCOUNTER — Inpatient Hospital Stay (HOSPITAL_COMMUNITY): Payer: BLUE CROSS/BLUE SHIELD

## 2016-04-04 ENCOUNTER — Telehealth (HOSPITAL_COMMUNITY): Payer: Self-pay | Admitting: Lactation Services

## 2016-04-04 ENCOUNTER — Ambulatory Visit (HOSPITAL_COMMUNITY)
Admission: RE | Admit: 2016-04-04 | Discharge: 2016-04-04 | Disposition: A | Discharge status: Home / Self Care | Payer: Federal, State, Local not specified - PPO | Source: Ambulatory Visit | Attending: Obstetrics | Admitting: Obstetrics

## 2016-04-04 NOTE — Lactation Note (Signed)
Lactation Consult  Mother's reason for visit:  Painful and difficult latch Visit Type: Outpatient Appointment Notes:   Consult:  Initial Lactation Consultant:  Broadus John  ________________________________________________________________________ Christina Charles Name: Christina Charles Date of Birth: 03/31/2014 Pediatrician: Maisie Fus Gender: female Gestational Age: [redacted]w[redacted]d (At Birth) Birth Weight: 8 lb 1.6 oz (3674 g) Weight at Discharge: Weight: 7 lb 12.9 oz ([id number] g)Date of Discharge: 04/02/2016 Filed Weights   03/31/16 1936 04/01/16 2353  Weight: 8 lb 1.6 oz (3674 g) 7 lb 12.9 oz (3540 g)   Last weight taken from location outside of [facility]: 7 lb 8 oz (ped)   Weight today: 7 lbs 10.6 oz (3478 g)   Due to weight loss of 11% yesterday at Pediatrician office, and very sore nipples, baby appeared dehydrated, with only 2 stools since birth. Mom comes in seeking assistance.  Breasts are full (not engorged), and nipples red and raw on tips.  Assistance given with latching baby on deeply.  Baby latched deeply, but tucks in lower lip, so showed Mom and FOB how to untuck lip.  Baby appeared nutritive, with deep jaw extensions, and swallowing audible.  Taught how to do alternate breast compression during feeding. Mom stated the discomfort was much improved.  After 25 mins on right breast, nipple pulled out and rounded.  Milk transfer only 8 ml.  Switched to cross cradle hold, and Mom latched baby on with instruction on hand placement for support.  Baby appeared more nutritive and latch felt comfortable.  Milk transfer better at 30 ml.  While FOB fed baby supplement, assisted Mom to pump using her pump.  50 ml of EBM obtained after 20 mins.  The flange of her Purely Yours pump appears tight, and nipple rubs inside.  Mom states pumping isn't comfortable.  She started pumping after Ped appointment yesterday.  She does have a friend's Medela PIS to use, bought tubing and  given 30 mm flanges for a more comfortable fit.  Recommended she call her OB about obtaining rx for APNO.   Plan- Breast feed often on cue, skin to skin.  Place baby skin to skin at 3 hrs if she is sleeping Offer 30 ml expressed breast milk by bottle using the "Pace Method" Pump both breasts 15-20 mins (massage and manual breast expression) Follow up Monday, 04/08/16 @ 12 n  ________________________________________________________________________  Mother's Name: Christina Charles Type of delivery:  vaginal Breastfeeding Experience:   Maternal Medical Conditions:  Polycystic ovarian syndrome fibroids Maternal Medications:  PNV , stool softener  ________________________________________________________________________  Breastfeeding History (Post Discharge)  Frequency of breastfeeding: every 2-3 hrs Duration of feeding: 10-20 with supplementation  Supplementation  Formula:  Volume 15 increasing to 30 ml Frequency:  Every 2-3 hrs after BF Total volume per day:  300 ml       Brand: Similac  Breastmilk:  Volume 5 ml Frequency:  Every 2-3 hrs Total volume per day:  50 ml  Method:  Bottle,   Pumping  Type of pump:  Ameda Frequency:  Every 2-3 hrs Volume:  3-5 ml  Infant Intake and Output Assessment  Voids:  6-8 in 24 hrs.  Color:  Clear yellow Stools:  1 in 24 hrs.  Color:  Ow[place] Shark (only 2 stools since birth)  ________________________________________________________________________  Maternal Breast Assessment  Breast:  Full Nipple:  Erect, Reddened and Bleeding Pain level:  4 Pain interventions:  Bra, Comfort gels and Expressed breast milk  _______________________________________________________________________ Feeding Assessment/Evaluation  Initial feeding assessment:  Infant's oral assessment:  WNL  Positioning:  Football Right breast  LATCH documentation:  Latch:  1 = Repeated attempts needed to sustain latch, nipple held in mouth throughout feeding,  stimulation needed to elicit sucking reflex.  Audible swallowing:  2 = Spontaneous and intermittent  Type of nipple:  2 = Everted at rest and after stimulation  Comfort (Breast/Nipple):  1 = Filling, red/small blisters or bruises, mild/mod discomfort  Hold (Positioning):  1 = Assistance needed to correctly position infant at breast and maintain latch  LATCH score:  7  Attached assessment:  Deep  Lips flanged:  No.  Lips untucked:  Yes.    Suck assessment:  Displays both  Tools:  Pump, Comfort gels and Bottle Instructed on use and cleaning of tool:  Yes.    Pre-feed weight:  3478 g  Post-feed weight:  3486 g Amount transferred:  8 ml   Additional Feeding Assessment -   Positioning:  Cross cradle Left breast  LATCH documentation:  Latch:  2 = Grasps breast easily, tongue down, lips flanged, rhythmical sucking.  Audible swallowing:  2 = Spontaneous and intermittent  Type of nipple:  2 = Everted at rest and after stimulation  Comfort (Breast/Nipple):  1 = Filling, red/small blisters or bruises, mild/mod discomfort  Hold (Positioning):  1 = Assistance needed to correctly position infant at breast and maintain latch  LATCH score:  8  Attached assessment:  Deep  Lips flanged:  No.  Lips untucked:  Yes.    Suck assessment:  Nutritive  Tools:  Pump, Comfort gels and Bottle Instructed on use and cleaning of tool:  Yes.    Pre-feed weight:  3486 g   Post-feed weight:  3516 g Amount transferred: 30 ml Amount supplemented:  30 ml   Total amount pumped post feed:  R 20 ml    L 30 ml  Using her Ameda Purely Yours pump.  Total amount transferred:  38 ml Total supplement given:  30 ml

## 2016-04-04 NOTE — Telephone Encounter (Signed)
[facility] returned telephone call r/e question mom has after [facility] appointment this morning.   Mom stated she has pumped 3x since OP appointment today and is getting 20-30 ml at home.   Mom could not remember the order of ice or warm compress with pumping.  LC explained to mom rationale for ice prior to pumping and warm compress with pumping to increase milk flow. Encouraged mom to wait 30 min after feeding before pumping breast. Mom is latching with each feeding and post-feeding after latches ~ 30 ml.   Follow-up with [facility] in OP Lactation appointment on Monday, April 24, 12pm

## 2016-04-06 ENCOUNTER — Telehealth (HOSPITAL_COMMUNITY): Payer: Self-pay | Admitting: Lactation Services

## 2016-04-06 NOTE — Telephone Encounter (Signed)
Patient called to confirm [facility] O/P appt. For Monday 4/24 at 12N , [facility] called back and left message to confirm appt.

## 2016-04-08 ENCOUNTER — Ambulatory Visit (HOSPITAL_COMMUNITY)
Admission: RE | Admit: 2016-04-08 | Discharge: 2016-04-08 | Disposition: A | Discharge status: Home / Self Care | Payer: BLUE CROSS/BLUE SHIELD | Source: Ambulatory Visit | Attending: Obstetrics | Admitting: Obstetrics

## 2016-04-08 NOTE — Lactation Note (Signed)
Lactation Consult  Mother's reason for visit: Follow-up for weight. Mom's goal is to give BF one last try before changes to exclusive breastmilk in a bottle Visit Type:  OP Appointment Notes:  Christina Charles has been exclusively feeding from a bottle. Mom reports that after her consult last week she successfully BF twice but when her milk came to volume baby would not latch.  Today it was noted that she does not flange her upper lip well. She also did not pull a gloved finger deeply into her mouth.  There may be an oral restriction posteriorly as some tongue humping was noted. Lateralization to the right was good but she did not lateralize to the left. Encouraged tongue exercises at home to help with tongue ROM.  Placed Christina Charles in a prone position and assisted her with neck ROM. This helped her to open her mouth wider, It was noted that when she was on her tummy and her head was turned to the left her right hip up lifted a bit. Encouraged parents to do tummy time at home a few times a day. Assisted mom to position baby in a football hold. Baby was latched using an off-center latch. Initially she just held the nipple in her mouth and needed stimulation to start suckling. Once she began, intermittent stimulation was needed for suckling to continue.  She did not fatigue during the feeding and with breast compression many swallows were observed. She transferred 1.7 oz on the right breast and then ate on the left for a total intake of 2.5 oz. Biggest concern is that stimulation was needed to initiated suckling.  Mom  Post-pump 60 ml of hind milk. Plan is to work on BF and exercises at home to see if there is improvement. Encouraged support group and smart start for weight checks.  Mom will call if she desires another OP appointment.  She may decided to feed BM in a bottle.  Consult:  Follow-Up Lactation Consultant:  Van Clines  ________________________________________________________________________ Christina Charles  Name: Christina Charles Date of Birth: 03/31/2014 Pediatrician: Maisie Fus Gender: female Gestational Age: [redacted]w[redacted]d (At Birth) Birth Weight: 8 lb 1.6 oz (3674 g) Weight at Discharge: Weight: 7 lb 12.9 oz (3540 g)Date of Discharge: 04/02/2016 [facility] Weights   03/31/16 1936 04/01/16 2353  Weight: 8 lb 1.6 oz (3674 g) 7 lb 12.9 oz (3540 g)   Weight today: 7#13 oz      ________________________________________________________________________  Mother's Name: Christina Charles Type of delivery:  Vagianl  ________________________________________________________________________      Output Assessment  Voids:  7-8 in 24 hrs.  Color:  Clear yellow Stools:  3-4 in 24 hrs.  Color:  Yellow  ________________________________________________________________________  Maternal Breast Assessment  Breast:  Full Nipple:  Erect and looked bruised even before baby latched Pain level:  0 Pain interventions:  All purpose nipple cream  _______________________________________________________________________

## 2016-12-17 DIAGNOSIS — L988 Other specified disorders of the skin and subcutaneous tissue: Secondary | ICD-10-CM | POA: Diagnosis not present

## 2016-12-17 DIAGNOSIS — D485 Neoplasm of uncertain behavior of skin: Secondary | ICD-10-CM | POA: Diagnosis not present

## 2017-02-11 DIAGNOSIS — K080 Exfoliation of teeth due to systemic causes: Secondary | ICD-10-CM | POA: Diagnosis not present

## 2017-04-15 DIAGNOSIS — Z30432 Encounter for removal of intrauterine contraceptive device: Secondary | ICD-10-CM | POA: Diagnosis not present

## 2017-08-14 DIAGNOSIS — K080 Exfoliation of teeth due to systemic causes: Secondary | ICD-10-CM | POA: Diagnosis not present

## 2017-08-27 DIAGNOSIS — Z3201 Encounter for pregnancy test, result positive: Secondary | ICD-10-CM | POA: Diagnosis not present

## 2017-09-02 DIAGNOSIS — H01111 Allergic dermatitis of right upper eyelid: Secondary | ICD-10-CM | POA: Diagnosis not present

## 2017-09-02 DIAGNOSIS — H01115 Allergic dermatitis of left lower eyelid: Secondary | ICD-10-CM | POA: Diagnosis not present

## 2017-09-02 DIAGNOSIS — H01114 Allergic dermatitis of left upper eyelid: Secondary | ICD-10-CM | POA: Diagnosis not present

## 2017-09-02 DIAGNOSIS — H01112 Allergic dermatitis of right lower eyelid: Secondary | ICD-10-CM | POA: Diagnosis not present

## 2017-09-19 DIAGNOSIS — Z3201 Encounter for pregnancy test, result positive: Secondary | ICD-10-CM | POA: Diagnosis not present

## 2017-09-19 DIAGNOSIS — Z23 Encounter for immunization: Secondary | ICD-10-CM | POA: Diagnosis not present

## 2017-09-30 DIAGNOSIS — Z3A10 10 weeks gestation of pregnancy: Secondary | ICD-10-CM | POA: Diagnosis not present

## 2017-09-30 DIAGNOSIS — Z118 Encounter for screening for other infectious and parasitic diseases: Secondary | ICD-10-CM | POA: Diagnosis not present

## 2017-09-30 DIAGNOSIS — O09521 Supervision of elderly multigravida, first trimester: Secondary | ICD-10-CM | POA: Diagnosis not present

## 2017-09-30 DIAGNOSIS — Z3689 Encounter for other specified antenatal screening: Secondary | ICD-10-CM | POA: Diagnosis not present

## 2017-09-30 LAB — OB RESULTS CONSOLE RUBELLA ANTIBODY, IGM: RUBELLA: NON-IMMUNE/NOT IMMUNE

## 2017-09-30 LAB — OB RESULTS CONSOLE HEPATITIS B SURFACE ANTIGEN: Hepatitis B Surface Ag: NEGATIVE

## 2017-09-30 LAB — OB RESULTS CONSOLE ANTIBODY SCREEN: Antibody Screen: NEGATIVE

## 2017-09-30 LAB — OB RESULTS CONSOLE ABO/RH: RH Type: POSITIVE

## 2017-09-30 LAB — OB RESULTS CONSOLE GC/CHLAMYDIA
CHLAMYDIA, DNA PROBE: NEGATIVE
GC PROBE AMP, GENITAL: NEGATIVE

## 2017-09-30 LAB — OB RESULTS CONSOLE RPR: RPR: NONREACTIVE

## 2017-09-30 LAB — OB RESULTS CONSOLE HIV ANTIBODY (ROUTINE TESTING): HIV: NONREACTIVE

## 2017-10-29 DIAGNOSIS — Z3A14 14 weeks gestation of pregnancy: Secondary | ICD-10-CM | POA: Diagnosis not present

## 2017-10-29 DIAGNOSIS — O99820 Streptococcus B carrier state complicating pregnancy: Secondary | ICD-10-CM | POA: Diagnosis not present

## 2017-10-29 DIAGNOSIS — Z3689 Encounter for other specified antenatal screening: Secondary | ICD-10-CM | POA: Diagnosis not present

## 2017-10-29 DIAGNOSIS — O09522 Supervision of elderly multigravida, second trimester: Secondary | ICD-10-CM | POA: Diagnosis not present

## 2017-10-29 LAB — OB RESULTS CONSOLE GBS: GBS: POSITIVE

## 2017-11-03 DIAGNOSIS — R8271 Bacteriuria: Secondary | ICD-10-CM | POA: Diagnosis not present

## 2017-11-11 DIAGNOSIS — Z361 Encounter for antenatal screening for raised alphafetoprotein level: Secondary | ICD-10-CM | POA: Diagnosis not present

## 2017-11-20 DIAGNOSIS — D2262 Melanocytic nevi of left upper limb, including shoulder: Secondary | ICD-10-CM | POA: Diagnosis not present

## 2017-11-20 DIAGNOSIS — D225 Melanocytic nevi of trunk: Secondary | ICD-10-CM | POA: Diagnosis not present

## 2017-11-20 DIAGNOSIS — L821 Other seborrheic keratosis: Secondary | ICD-10-CM | POA: Diagnosis not present

## 2017-11-20 DIAGNOSIS — D2261 Melanocytic nevi of right upper limb, including shoulder: Secondary | ICD-10-CM | POA: Diagnosis not present

## 2017-12-03 DIAGNOSIS — Z3A19 19 weeks gestation of pregnancy: Secondary | ICD-10-CM | POA: Diagnosis not present

## 2017-12-03 DIAGNOSIS — O09522 Supervision of elderly multigravida, second trimester: Secondary | ICD-10-CM | POA: Diagnosis not present

## 2017-12-03 DIAGNOSIS — Z3685 Encounter for antenatal screening for Streptococcus B: Secondary | ICD-10-CM | POA: Diagnosis not present

## 2017-12-16 NOTE — L&D Delivery Note (Signed)
Delivery Note At 6:30 AM a viable female was delivered via Vaginal, Spontaneous (Presentation: DOA;  ).  APGAR: 9, 9; weight  .  pending Placenta status: ,complete, spontaneous .  Cord:  3VC with the following complications: none.  Cord pH: n/a  Anesthesia:  epidural Episiotomy: None Lacerations: 2nd degree;Periurethral Suture Repair: 3.0 vicryl rapide 4.0 vicryl to periurethral- 2 figue of 8 Est. Blood Loss (mL):    Mom to postpartum.  Baby to Couplet care / Skin to Skin.  Ala Dach 04/27/2018, 6:48 AM

## 2017-12-29 DIAGNOSIS — O09522 Supervision of elderly multigravida, second trimester: Secondary | ICD-10-CM | POA: Diagnosis not present

## 2017-12-29 DIAGNOSIS — Z3A23 23 weeks gestation of pregnancy: Secondary | ICD-10-CM | POA: Diagnosis not present

## 2018-01-26 DIAGNOSIS — Z3A27 27 weeks gestation of pregnancy: Secondary | ICD-10-CM | POA: Diagnosis not present

## 2018-01-26 DIAGNOSIS — Z3689 Encounter for other specified antenatal screening: Secondary | ICD-10-CM | POA: Diagnosis not present

## 2018-01-26 DIAGNOSIS — O09522 Supervision of elderly multigravida, second trimester: Secondary | ICD-10-CM | POA: Diagnosis not present

## 2018-02-10 DIAGNOSIS — Z3A29 29 weeks gestation of pregnancy: Secondary | ICD-10-CM | POA: Diagnosis not present

## 2018-02-10 DIAGNOSIS — Z3689 Encounter for other specified antenatal screening: Secondary | ICD-10-CM | POA: Diagnosis not present

## 2018-02-10 DIAGNOSIS — Z23 Encounter for immunization: Secondary | ICD-10-CM | POA: Diagnosis not present

## 2018-02-10 DIAGNOSIS — O09523 Supervision of elderly multigravida, third trimester: Secondary | ICD-10-CM | POA: Diagnosis not present

## 2018-02-24 DIAGNOSIS — Z3A31 31 weeks gestation of pregnancy: Secondary | ICD-10-CM | POA: Diagnosis not present

## 2018-02-24 DIAGNOSIS — O09523 Supervision of elderly multigravida, third trimester: Secondary | ICD-10-CM | POA: Diagnosis not present

## 2018-02-25 DIAGNOSIS — K080 Exfoliation of teeth due to systemic causes: Secondary | ICD-10-CM | POA: Diagnosis not present

## 2018-03-10 DIAGNOSIS — O09523 Supervision of elderly multigravida, third trimester: Secondary | ICD-10-CM | POA: Diagnosis not present

## 2018-03-10 DIAGNOSIS — Z3A33 33 weeks gestation of pregnancy: Secondary | ICD-10-CM | POA: Diagnosis not present

## 2018-03-24 DIAGNOSIS — Z3A35 35 weeks gestation of pregnancy: Secondary | ICD-10-CM | POA: Diagnosis not present

## 2018-03-24 DIAGNOSIS — O09523 Supervision of elderly multigravida, third trimester: Secondary | ICD-10-CM | POA: Diagnosis not present

## 2018-04-03 DIAGNOSIS — J019 Acute sinusitis, unspecified: Secondary | ICD-10-CM | POA: Diagnosis not present

## 2018-04-06 DIAGNOSIS — O09523 Supervision of elderly multigravida, third trimester: Secondary | ICD-10-CM | POA: Diagnosis not present

## 2018-04-06 DIAGNOSIS — Z3A37 37 weeks gestation of pregnancy: Secondary | ICD-10-CM | POA: Diagnosis not present

## 2018-04-15 DIAGNOSIS — O09523 Supervision of elderly multigravida, third trimester: Secondary | ICD-10-CM | POA: Diagnosis not present

## 2018-04-15 DIAGNOSIS — Z3A38 38 weeks gestation of pregnancy: Secondary | ICD-10-CM | POA: Diagnosis not present

## 2018-04-16 ENCOUNTER — Other Ambulatory Visit: Payer: Self-pay | Admitting: Obstetrics

## 2018-04-16 ENCOUNTER — Encounter (HOSPITAL_COMMUNITY): Payer: Self-pay | Admitting: *Deleted

## 2018-04-16 ENCOUNTER — Telehealth (HOSPITAL_COMMUNITY): Payer: Self-pay | Admitting: *Deleted

## 2018-04-16 NOTE — Telephone Encounter (Signed)
Preadmission screen  

## 2018-04-22 DIAGNOSIS — Z3A39 39 weeks gestation of pregnancy: Secondary | ICD-10-CM | POA: Diagnosis not present

## 2018-04-22 DIAGNOSIS — O09523 Supervision of elderly multigravida, third trimester: Secondary | ICD-10-CM | POA: Diagnosis not present

## 2018-04-26 NOTE — H&P (Signed)
Christina Charles is a 42 y.o. G2P1001 at [redacted]w[redacted]d presenting for IOL. Pt notes rare contractions. Good fetal movement, No vaginal bleeding, not leaking fluid.  PNCare at [place] since 8 wks - Dated by LMP c/w 8 wk u/s - R-NI, though immune in last preg, will repeat titers - GBS pos - AMA, nl Informaseq - GERD   Prenatal Transfer Tool  Maternal Diabetes: No Genetic Screening: Normal Maternal Ultrasounds/Referrals: Normal Fetal Ultrasounds or other Referrals:  None Maternal Substance Abuse:  No Significant Maternal Medications:  None Significant Maternal Lab Results: None     OB History    Gravida  2   Para  1   Term  1   Preterm      AB      Living  1     SAB      TAB      Ectopic      Multiple  0   Live Births  1          Past Medical History:  Diagnosis Date  . GERD (gastroesophageal reflux disease)   . Hx of varicella   . Vaginal Pap smear, abnormal    Past Surgical History:  Procedure Laterality Date  . NO PAST SURGERIES     Family History: family history includes Cancer in her mother and paternal grandmother. Social History:  reports that she has never smoked. She has never used smokeless tobacco. She reports that she does not drink alcohol or use drugs.  Review of Systems - Negative except discomfort of pregnancy   Physical Exam: will update  Prenatal labs: ABO, Rh: AB/Positive/-- (10/16 0000) Antibody: Negative (10/16 0000)\\ Rubella: Nonimmune (10/16 0000) RPR: Nonreactive (10/16 0000)  HBsAg: Negative (10/16 0000)  HIV: Non-reactive (10/16 0000)  GBS: Positive (11/14 0000)  1 hr Glucola 132, nl 3 hr GTT  Genetic screening nl Informaseq, nl AFP Anatomy US normal   Assessment/Plan: 42 y.o. G2P1001 at 75'0 - IOL, plan pitocin then AROM - GBS pos - check rubella status - anticipate SVD  Ala Dach 04/26/2018, 3:50 PM

## 2018-04-27 ENCOUNTER — Encounter (HOSPITAL_COMMUNITY): Payer: Self-pay

## 2018-04-27 ENCOUNTER — Inpatient Hospital Stay (HOSPITAL_COMMUNITY)
Admission: RE | Admit: 2018-04-27 | Discharge: 2018-04-29 | DRG: 807 | Disposition: A | Discharge status: Home / Self Care | Payer: Federal, State, Local not specified - PPO | Source: Ambulatory Visit | Attending: Obstetrics | Admitting: Obstetrics

## 2018-04-27 ENCOUNTER — Inpatient Hospital Stay (HOSPITAL_COMMUNITY): Payer: Federal, State, Local not specified - PPO | Admitting: Anesthesiology

## 2018-04-27 ENCOUNTER — Inpatient Hospital Stay (HOSPITAL_COMMUNITY)
Admission: AD | Admit: 2018-04-27 | Payer: Federal, State, Local not specified - PPO | Source: Ambulatory Visit | Admitting: Obstetrics

## 2018-04-27 DIAGNOSIS — Z3490 Encounter for supervision of normal pregnancy, unspecified, unspecified trimester: Secondary | ICD-10-CM | POA: Diagnosis present

## 2018-04-27 DIAGNOSIS — Z23 Encounter for immunization: Secondary | ICD-10-CM

## 2018-04-27 DIAGNOSIS — Z3A40 40 weeks gestation of pregnancy: Secondary | ICD-10-CM | POA: Diagnosis not present

## 2018-04-27 DIAGNOSIS — Z3483 Encounter for supervision of other normal pregnancy, third trimester: Secondary | ICD-10-CM | POA: Diagnosis present

## 2018-04-27 DIAGNOSIS — O99824 Streptococcus B carrier state complicating childbirth: Secondary | ICD-10-CM | POA: Diagnosis not present

## 2018-04-27 LAB — CBC
HCT: 35.9 % — ABNORMAL LOW (ref 36.0–46.0)
Hemoglobin: 12.4 g/dL (ref 12.0–15.0)
MCH: 30.3 pg (ref 26.0–34.0)
MCHC: 34.5 g/dL (ref 30.0–36.0)
MCV: 87.8 fL (ref 78.0–100.0)
Platelets: 222 10*3/uL (ref 150–400)
RBC: 4.09 MIL/uL (ref 3.87–5.11)
RDW: 13.5 % (ref 11.5–15.5)
WBC: 7.3 10*3/uL (ref 4.0–10.5)

## 2018-04-27 LAB — TYPE AND SCREEN
ABO/RH(D): AB POS
Antibody Screen: NEGATIVE

## 2018-04-27 LAB — RPR: RPR Ser Ql: NONREACTIVE

## 2018-04-27 MED ORDER — ONDANSETRON HCL 4 MG/2ML IJ SOLN: 4.0000 mg | Freq: Four times a day (QID) | INTRAMUSCULAR | Status: DC | PRN

## 2018-04-27 MED ORDER — OXYCODONE HCL 5 MG PO TABS: 5.0000 mg | ORAL_TABLET | ORAL | Status: DC | PRN

## 2018-04-27 MED ORDER — SOD CITRATE-CITRIC ACID 500-334 MG/5ML PO SOLN: 30.0000 mL | ORAL | Status: DC | PRN

## 2018-04-27 MED ORDER — SENNOSIDES-DOCUSATE SODIUM 8.6-50 MG PO TABS
2.0000 | ORAL_TABLET | ORAL | Status: DC
  Administered 2018-04-28 – 2018-04-29 (×2): 2 via ORAL
  Filled 2018-04-27 (×2): qty 2

## 2018-04-27 MED ORDER — LIDOCAINE HCL (PF) 1 % IJ SOLN
INTRAMUSCULAR | Status: AC
  Filled 2018-04-27: qty 30

## 2018-04-27 MED ORDER — EPHEDRINE 5 MG/ML INJ
10.0000 mg | INTRAVENOUS | Status: DC | PRN
  Filled 2018-04-27: qty 2

## 2018-04-27 MED ORDER — PHENYLEPHRINE 40 MCG/ML (10ML) SYRINGE FOR IV PUSH (FOR BLOOD PRESSURE SUPPORT)
80.0000 ug | PREFILLED_SYRINGE | INTRAVENOUS | Status: DC | PRN
  Filled 2018-04-27: qty 10
  Filled 2018-04-27: qty 5

## 2018-04-27 MED ORDER — LACTATED RINGERS IV SOLN
500.0000 mL | INTRAVENOUS | Status: DC | PRN
  Administered 2018-04-27: 500 mL via INTRAVENOUS

## 2018-04-27 MED ORDER — DIPHENHYDRAMINE HCL 25 MG PO CAPS: 25.0000 mg | ORAL_CAPSULE | Freq: Four times a day (QID) | ORAL | Status: DC | PRN

## 2018-04-27 MED ORDER — PHENYLEPHRINE 40 MCG/ML (10ML) SYRINGE FOR IV PUSH (FOR BLOOD PRESSURE SUPPORT)
80.0000 ug | PREFILLED_SYRINGE | INTRAVENOUS | Status: DC | PRN
  Administered 2018-04-27 (×2): 80 ug via INTRAVENOUS
  Filled 2018-04-27: qty 5

## 2018-04-27 MED ORDER — ACETAMINOPHEN 325 MG PO TABS: 650.0000 mg | ORAL_TABLET | ORAL | Status: DC | PRN

## 2018-04-27 MED ORDER — COCONUT OIL OIL: 1.0000 "application " | TOPICAL_OIL | Status: DC | PRN

## 2018-04-27 MED ORDER — OXYTOCIN BOLUS FROM INFUSION
500.0000 mL | Freq: Once | INTRAVENOUS | Status: AC
  Administered 2018-04-27: 500 mL via INTRAVENOUS

## 2018-04-27 MED ORDER — EPHEDRINE 5 MG/ML INJ
10.0000 mg | INTRAVENOUS | Status: DC | PRN
  Filled 2018-04-27: qty 2
  Filled 2018-04-27: qty 4

## 2018-04-27 MED ORDER — LIDOCAINE HCL (PF) 1 % IJ SOLN
30.0000 mL | INTRAMUSCULAR | Status: DC | PRN
  Filled 2018-04-27: qty 30

## 2018-04-27 MED ORDER — DIPHENHYDRAMINE HCL 50 MG/ML IJ SOLN: 12.5000 mg | INTRAMUSCULAR | Status: DC | PRN

## 2018-04-27 MED ORDER — ONDANSETRON HCL 4 MG/2ML IJ SOLN: 4.0000 mg | INTRAMUSCULAR | Status: DC | PRN

## 2018-04-27 MED ORDER — OXYTOCIN 40 UNITS IN LACTATED RINGERS INFUSION - SIMPLE MED: 2.5000 [IU]/h | INTRAVENOUS | Status: DC

## 2018-04-27 MED ORDER — IBUPROFEN 600 MG PO TABS
600.0000 mg | ORAL_TABLET | Freq: Four times a day (QID) | ORAL | Status: DC
  Administered 2018-04-27 – 2018-04-29 (×8): 600 mg via ORAL
  Filled 2018-04-27 (×8): qty 1

## 2018-04-27 MED ORDER — SIMETHICONE 80 MG PO CHEW: 80.0000 mg | CHEWABLE_TABLET | ORAL | Status: DC | PRN

## 2018-04-27 MED ORDER — LACTATED RINGERS IV SOLN
INTRAVENOUS | Status: DC
  Administered 2018-04-27: 01:00:00 via INTRAVENOUS

## 2018-04-27 MED ORDER — PHENYLEPHRINE 40 MCG/ML (10ML) SYRINGE FOR IV PUSH (FOR BLOOD PRESSURE SUPPORT)
80.0000 ug | PREFILLED_SYRINGE | INTRAVENOUS | Status: DC | PRN
  Filled 2018-04-27: qty 5

## 2018-04-27 MED ORDER — FENTANYL 2.5 MCG/ML BUPIVACAINE 1/10 % EPIDURAL INFUSION (WH - ANES): 14.0000 mL/h | INTRAMUSCULAR | Status: DC | PRN

## 2018-04-27 MED ORDER — FENTANYL 2.5 MCG/ML BUPIVACAINE 1/10 % EPIDURAL INFUSION (WH - ANES)
14.0000 mL/h | INTRAMUSCULAR | Status: DC | PRN
  Administered 2018-04-27: 14 mL/h via EPIDURAL
  Filled 2018-04-27: qty 100

## 2018-04-27 MED ORDER — BENZOCAINE-MENTHOL 20-0.5 % EX AERO
1.0000 "application " | INHALATION_SPRAY | CUTANEOUS | Status: DC | PRN
  Administered 2018-04-27: 1 via TOPICAL
  Filled 2018-04-27: qty 56

## 2018-04-27 MED ORDER — DIBUCAINE 1 % RE OINT: 1.0000 "application " | TOPICAL_OINTMENT | RECTAL | Status: DC | PRN

## 2018-04-27 MED ORDER — OXYTOCIN 40 UNITS IN LACTATED RINGERS INFUSION - SIMPLE MED
1.0000 m[IU]/min | INTRAVENOUS | Status: DC
  Administered 2018-04-27: 2 m[IU]/min via INTRAVENOUS
  Administered 2018-04-27: 6 m[IU]/min via INTRAVENOUS
  Filled 2018-04-27: qty 1000

## 2018-04-27 MED ORDER — LACTATED RINGERS IV SOLN
500.0000 mL | Freq: Once | INTRAVENOUS | Status: AC
  Administered 2018-04-27: 500 mL via INTRAVENOUS

## 2018-04-27 MED ORDER — PRENATAL MULTIVITAMIN CH
1.0000 | ORAL_TABLET | Freq: Every day | ORAL | Status: DC
  Administered 2018-04-27 – 2018-04-28 (×2): 1 via ORAL
  Filled 2018-04-27 (×2): qty 1

## 2018-04-27 MED ORDER — LIDOCAINE HCL (PF) 1 % IJ SOLN
INTRAMUSCULAR | Status: DC | PRN
  Administered 2018-04-27: 8 mL via EPIDURAL

## 2018-04-27 MED ORDER — ONDANSETRON HCL 4 MG PO TABS: 4.0000 mg | ORAL_TABLET | ORAL | Status: DC | PRN

## 2018-04-27 MED ORDER — TERBUTALINE SULFATE 1 MG/ML IJ SOLN
0.2500 mg | Freq: Once | INTRAMUSCULAR | Status: DC | PRN
  Filled 2018-04-27: qty 1

## 2018-04-27 MED ORDER — ACETAMINOPHEN 325 MG PO TABS
650.0000 mg | ORAL_TABLET | ORAL | Status: DC | PRN
  Administered 2018-04-27 – 2018-04-28 (×2): 650 mg via ORAL
  Filled 2018-04-27 (×2): qty 2

## 2018-04-27 MED ORDER — PENICILLIN G POTASSIUM 5000000 UNITS IJ SOLR
5.0000 10*6.[IU] | Freq: Once | INTRAMUSCULAR | Status: AC
  Administered 2018-04-27: 5 10*6.[IU] via INTRAVENOUS
  Filled 2018-04-27: qty 5

## 2018-04-27 MED ORDER — WITCH HAZEL-GLYCERIN EX PADS: 1.0000 "application " | MEDICATED_PAD | CUTANEOUS | Status: DC | PRN

## 2018-04-27 MED ORDER — LACTATED RINGERS IV SOLN: 500.0000 mL | Freq: Once | INTRAVENOUS | Status: DC

## 2018-04-27 MED ORDER — TETANUS-DIPHTH-ACELL PERTUSSIS 5-2.5-18.5 LF-MCG/0.5 IM SUSP: 0.5000 mL | Freq: Once | INTRAMUSCULAR | Status: DC

## 2018-04-27 MED ORDER — ZOLPIDEM TARTRATE 5 MG PO TABS: 5.0000 mg | ORAL_TABLET | Freq: Every evening | ORAL | Status: DC | PRN

## 2018-04-27 MED ORDER — PENICILLIN G POT IN DEXTROSE 60000 UNIT/ML IV SOLN
3.0000 10*6.[IU] | INTRAVENOUS | Status: DC
  Administered 2018-04-27: 3 10*6.[IU] via INTRAVENOUS
  Filled 2018-04-27 (×3): qty 50

## 2018-04-27 MED ORDER — OXYCODONE HCL 5 MG PO TABS: 10.0000 mg | ORAL_TABLET | ORAL | Status: DC | PRN

## 2018-04-27 NOTE — Progress Notes (Signed)
S: Doing well, no complaints, pain well controlled with epidural, just epidural.  Pt with epidural related hypotension, felt lightheaded, also prolonged fetal decel  O: BP 118/64   Pulse (!) 113   Temp 98.3 F (36.8 C) (Oral)   Resp 18   Ht 5\' 4"  (1.626 m)   Wt 89.6 kg (197 lb 9.6 oz)   SpO2 99%   BMI 33.92 kg/m    FHT:  FHR: 140s bpm, variability: moderate,  accelerations:  Present,  decelerations:  Present 8 min decel after epidural during hypotension, recovery with position change and hands and knees UC:   irregulat SVE:   Dilation: 4 Effacement (%): 50 Station: -3 Exam by:: Kassi Esteve MD   A / P:  42 y.o.  OB History  Gravida Para Term Preterm AB Living  2 1 1  0 0 1  SAB TAB Ectopic Multiple Live Births  0 0 0 0 1   at [redacted]w[redacted]d IOL, term, AMA, cervix favorable  Resume pitocin, AROM now GBS pos, on PCN  Fetal Wellbeing:  Category I Pain Control:  Epidural  Anticipated MOD:  NSVD  Ala Dach 04/27/2018, 4:01 AM

## 2018-04-27 NOTE — Anesthesia Procedure Notes (Signed)
Epidural Patient location during procedure: OB Start time: 04/27/2018 3:10 AM End time: 04/27/2018 3:15 AM  Staffing Anesthesiologist: Janeece Riggers, MD  Preanesthetic Checklist Completed: patient identified, site marked, surgical consent, pre-op evaluation, timeout performed, IV checked, risks and benefits discussed and monitors and equipment checked  Epidural Patient position: sitting Prep: site prepped and draped and DuraPrep Patient monitoring: continuous pulse ox and blood pressure Approach: midline Location: L4-L5 Injection technique: LOR air  Needle:  Needle type: Tuohy  Needle gauge: 17 G Needle length: 9 cm and 9 Needle insertion depth: 6 cm Catheter type: closed end flexible Catheter size: 19 Gauge Catheter at skin depth: 11 cm Test dose: negative  Assessment Events: blood not aspirated, injection not painful, no injection resistance, negative IV test and no paresthesia

## 2018-04-27 NOTE — Anesthesia Postprocedure Evaluation (Signed)
Anesthesia Post Note  Patient: Christina Charles  Procedure(s) Performed: AN AD HOC LABOR EPIDURAL     Patient location during evaluation: Mother Baby Anesthesia Type: Epidural Level of consciousness: awake and alert Pain management: pain level controlled Vital Signs Assessment: post-procedure vital signs reviewed and stable Respiratory status: spontaneous breathing Cardiovascular status: stable Postop Assessment: no headache, patient able to bend at knees, no backache, no apparent nausea or vomiting, epidural receding, adequate PO intake and able to ambulate Anesthetic complications: no    Last Vitals:  Vitals:   04/27/18 0940 04/27/18 1400  BP: 127/78 114/67  Pulse: 94 83  Resp: 18 20  Temp: 36.6 C 36.9 C  SpO2:      Last Pain:  Vitals:   04/27/18 1400  TempSrc: Oral  PainSc: 0-No pain   Pain Goal:                 [name]

## 2018-04-27 NOTE — Lactation Note (Signed)
Lactation Consultation Note  Patient Name: Christina Charles [id number] Date: 04/27/2018 Reason for consult: Initial assessment;Term Breastfeeding consultation services and support information given to patient.  This is mom's second baby and 6 hours old.  Mom states baby is latching well.  Instructed to feed with feeding cues and call for assist prn  No questions at this time.  Maternal Data    Feeding    LATCH Score                   Interventions    Lactation Tools Discussed/Used     Consult Status Consult Status: Follow-up Date: 04/28/18 Follow-up type: In-patient    [name] 04/27/2018, 1:41 PM

## 2018-04-27 NOTE — Lactation Note (Signed)
This note was copied from a baby's chart. Lactation Consultation Note  Patient Name: Christina Charles [id number] Date: 04/27/2018   [facility] Follow Up Visit:  Infant feeding in the cross cradle hold as I entered the room.  Mother stated she felt like breastfeeding is going well this feed after taking a few hours off  when he was sleepy.  She is experiencing slight pain so I reassessed latch.  Top lip is flanged but pulled down on chin to obtain a deeper latch and mother stated that felt much better.  Infant with slow rhythmic sucks.  Encouraged mother to continue STS, breast massage and hand expression.  She has a colostrum container and will save all EBM and feed back to baby.  Reminded to feed 8-12 times/24 hours or earlier if he shows feeding cues.  Will call for latch assistance as needed.      Dawnyel Leven R Teah Votaw 04/27/2018, 11:30 PM

## 2018-04-27 NOTE — Anesthesia Preprocedure Evaluation (Signed)

## 2018-04-28 LAB — BIRTH TISSUE RECOVERY COLLECTION (PLACENTA DONATION)

## 2018-04-28 MED ORDER — MEASLES, MUMPS & RUBELLA VAC ~~LOC~~ INJ
0.5000 mL | INJECTION | Freq: Once | SUBCUTANEOUS | Status: AC
  Administered 2018-04-28: 0.5 mL via SUBCUTANEOUS
  Filled 2018-04-28: qty 0.5

## 2018-04-28 NOTE — Lactation Note (Signed)
This note was copied from a baby's chart. Lactation Consultation Note  Patient Name: Christina Charles [id number] Date: 04/28/2018 Reason for consult: Follow-up assessment  Visited with P2 Mom, baby 59 hrs old, born at term.  Mom started pumping due to baby being very sleepy this am.  Mom teary as she is afraid baby will be more sleepy after circumcision.   Mom had poor experience with first child and ended up pumping and bottle feeding. Baby latched and breastfed well for first 24 hrs.  6 breastfeedings in first 24 hrs, latch scores of 8. Mom pumping and obtained 5 ml of colostrum.   Assisted with latch on right side using football hold.  Reviewed hand expression, colostrum expressed.  Breasts feel full.   Baby not rooting.  Unable to stimulate baby to latch. Assisted Mom to finger feed use 5 fr feeding tube and syringe.  Baby needed a lot stimulation to suck on finger.  Eventually took the colostrum.  Placed baby STS on Mom's chest.  Mom to call for assistance prn.  Plan- 1- Breastfeed often on cue, STS.  Hand express prior to latch.  Call for RN/LC to assess latch at next feeding 2- Pump both breasts every 2-3 hrs for 15 mins on initiation setting if baby doesn't awaken to feed. 3- Feed baby colostrum expressed. 4- Keep baby STS as much as possible.  Lactation Tools Discussed/Used Tools: 47F feeding tube / Syringe Breast pump type: Double-Electric Breast Pump Pump Review: Setup, frequency, and cleaning Initiated by:: Wiggins RN Date initiated:: 04/28/18   Consult Status Consult Status: Follow-up Date: 04/29/18 Follow-up type: [place] 04/28/2018, 12:49 PM

## 2018-04-28 NOTE — Progress Notes (Signed)
PPD #1, SVD, 2nd degree and periurethral repair, baby boy "Marland Kitchen"  S:  Reports feeling good, minimal discomfort             Tolerating po/ No nausea or vomiting / Denies dizziness or SOB             Bleeding is light             Pain controlled with Motrin              Up ad lib / ambulatory / voiding QS  Newborn breast feeding and pumping - still working with feedings; states he has been sleepy today  / Circumcision - planning prior to discharge   O:               VS: BP 118/71 (BP Location: Right Arm)   Pulse 79   Temp 97.9 F (36.6 C) (Oral)   Resp 18   Ht '5\' 4"'  (1.626 m)   Wt 89.6 kg (197 lb 9.6 oz)   SpO2 97%   Breastfeeding? Unknown   BMI 33.92 kg/m    LABS:              Recent Labs    04/27/18 0117  WBC 7.3  HGB 12.4  PLT 222               Blood type: --/--/AB POS (05/13 0117)  Rubella: Nonimmune (10/16 0000)                     I&O: Intake/Output      05/13 0701 - 05/14 0700 05/14 0701 - 05/15 0700   Blood 114    Total Output 114    Net -114                       Physical Exam:             Alert and oriented X3  Lungs: Clear and unlabored  Heart: regular rate and rhythm / no murmurs  Abdomen: soft, non-tender, non-distended              Fundus: firm, non-tender, U-2  Perineum: well approximated 2nd degree and periurethral; no edema, erythema, or ecchymosis  Lochia: small, no clots   Extremities: no edema, no calf pain or tenderness    A: PPD # 1, SVD  2nd degree repair and periurethral lac  Rubella Non-immue   Doing well - stable status  P: Routine post partum orders  S/p MMR vaccine today   See lactation today  Encouraged to rest when baby rests  Circ prior to d/c  Declined early d/c; Anticipate d/c home tomorrow   Lars Pinks, MSN, CNM [facility]

## 2018-04-29 MED ORDER — IBUPROFEN 600 MG PO TABS: 600.0000 mg | ORAL_TABLET | Freq: Four times a day (QID) | ORAL | 0 refills | Status: AC

## 2018-04-29 NOTE — Discharge Summary (Signed)
Obstetric Discharge Summary Reason for Admission: induction of labor - ELECTIVE induction at 40 weeks Prenatal Procedures: none Intrapartum Procedures: spontaneous vaginal delivery, GBS prophylaxis and epidural Postpartum Procedures: Rubella Ig Complications-Operative and Postpartum: 2nd degree perineal laceration Hemoglobin  Date Value Ref Range Status  04/27/2018 12.4 12.0 - 15.0 g/dL Final   HCT  Date Value Ref Range Status  04/27/2018 35.9 (L) 36.0 - 46.0 % Final    Physical Exam:  General: alert, cooperative and no distress Lochia: appropriate Uterine Fundus: firm Incision: healing well DVT Evaluation: No evidence of DVT seen on physical exam.  Discharge Diagnoses: Term Pregnancy-delivered after elective induction at 40 weeks  Discharge Information: Date: 04/29/2018 Activity: pelvic rest Diet: routine Medications: PNV and Ibuprofen Condition: stable Instructions: refer to practice specific booklet Discharge to: home Follow-up Information    Christina Gell, MD. Schedule an appointment as soon as possible for a visit in 6 week(s).   Specialty:  Obstetrics and Gynecology Contact information: [place] [address] [phone]           Newborn Data: Live born female  Birth Weight: 7 lb 12 oz (3515 g) APGAR: 91, 9  Newborn Delivery   Birth date/time:  [birth year]2018 06:30:00 Delivery type:  Vaginal, Spontaneous     Home with mother.  Christina Charles 04/29/2018, 9:04 AM

## 2018-04-29 NOTE — Lactation Note (Signed)
This note was copied from a baby's chart. Lactation Consultation Note  Patient Name: Christina Charles Date: 04/29/2018    Visited with Mom on day of discharge, baby 3 hrs old.  Mom states that baby is latching to breast better, but still tends to falls asleep.  Offered to watch a latch prior to discharge.  Baby at 6% weight loss.  Mom states she started formula feeding by bottle since baby was "hungry all the time".  Talked about normal cluster feeding on second night of life.    Mom states she has been double pumping, and obtaining more colostrum.  Encouraged breast massage and hand expression along with double pumping every 2-3 hrs.    Encouraged keeping baby STS on chest as much as possible. Feeding on cue 8-12 times per 24 hrs.  Mom has an [employer] at home, Mom aware of [employer] DEBP rental available in gift shop.  Referral sent to OP lactation for follow-up, which Mom agreeable to.  Mom aware of OP lactation services available to her, and encouraged her to call prn.  Broadus John 04/29/2018, 8:46 AM

## 2018-04-29 NOTE — Progress Notes (Signed)
PPD 2 SVD  S:  Reports feeling well - ready to go home             Tolerating po/ No nausea or vomiting             Bleeding is light             Pain controlled with motrin             Up ad lib / ambulatory / voiding QS  Newborn Breast  O:               VS: BP 106/67 (BP Location: Left Arm)   Pulse 75   Temp 97.9 F (36.6 C) (Oral)   Resp 18   Ht '5\' 4"'  (1.626 m)   Wt 89.6 kg (197 lb 9.6 oz)   SpO2 97%   Breastfeeding? Unknown   BMI 33.92 kg/m    LABS:              Recent Labs    04/27/18 0117  WBC 7.3  HGB 12.4  PLT 222               Blood type: --/--/AB POS (05/13 0117)  Rubella: Nonimmune (10/16 0000)   - MMR ordered for administration prior to DC                        Physical Exam:             Alert and oriented X3  Abdomen: soft, non-tender, non-distended              Fundus: firm, non-tender, U-1  Perineum: no edema  Lochia: light  Extremities: no edema, no calf pain or tenderness   A: PPD # 2   Doing well - stable status  P: Routine post partum orders  DC home - WOB booklet - instructions reviewed  Artelia Laroche CNM, MSN, [facility] 04/29/2018, 9:01 AM

## 2018-05-04 ENCOUNTER — Ambulatory Visit: Payer: Self-pay

## 2018-05-04 NOTE — Lactation Note (Signed)
This note was copied from a baby's chart.  05/04/2018  Name: Christina Charles MRN: [id number] Date of Birth: 04/27/2016 Gestational Age: Gestational Age: [redacted]w[redacted]d Birth Weight: 124 oz Weight today:    7 pounds 14.8 ounces (3594 grams) with clean newborn diaper.   Christina Charles Kitchen has gained 303 grams in the last 5 days with an average daily weight gain of 61 grams a day.   Parents presents today with both parents for feeding assessment.   Mom with large breasts that are engorged. She has been using heat prior to pumping and ice afterwards. Enc her to use ice before and can use heat afterwards. Mom is pumping 8 x a day and getting 3 oz/pumping, she reports her milk supply was more abundant with first child.   Mom has not been latching infant to the breast as he is very sleepy at the breast. Enc mom to allow infant to practice at the breast with feedings. Discussed using a slower flow NB nipple while trying to get infant to the breast.   Infant with thick labial frenulum that inserts at the bottom of the gum ridge. Upper lip is tight with flanging and needs flanging on the breast. Tongue with good mobility, suction and tongue cupping. Infant opens wide to latch to the breast, needing some upper and lower lip flanging with feeding.   Mom is engorged today even with pumping. She has been putting heat on the breasts prior to pumping, reviewed engorgement treatment. Mom did pump post BF and obtained 5.5 ounces. That is the most she has been able to pump.   Mom using # 30 flanges, they are a good fit for her. Reviewed watching how nipple is moving in the flange to know when to change sizes.   Mom latches infant to the breast well. Infant opens mouth wide and latches well. He needs some lip flanging after latch. Mom with larger nipple so easy for infant to just latch to the nipple, mom does well getting him deep. Infant however does not suckle well. Tried the 5 french feeding tube at the breast and he did  not feed much better.   Infant to follow with Dr. Maisie Fus on 5/31. [facility] has not called as of yet. Mom to follow up with Lactation as needed.   Parents reports all questions have been answered. Parents can call with any questions/concerns as needed.   General Information: Mother's reason for visit: difficulty with latch Consult: Initial Lactation consultant: Nonah Mattes RN,IBCLC Breastfeeding experience: not latching, pumping and bottle feeding   Maternal medications: Pre-natal vitamin  Breastfeeding History: Frequency of breast feeding: 0 Duration of feeding: 0  Supplementation: Supplement method: bottle([facility])         Breast milk volume: 3 oz Breast milk frequency: every 3 hours Total breast milk volume per day: 24 ounces Pump type: Symphony Pump frequency: every 3 hours Pump volume: 3 ounces  Infant Output Assessment: Voids per 24 hours: 8+ Urine color: Clear yellow Stools per 24 hours: 6-7 Stool color: Yellow  Breast Assessment: Breast: Engorged Nipple: Erect(nipples are purple in appearance, mom reports this is the way they normally look) Pain level: 0 Pain interventions: Bra, Lanolin  Feeding Assessment: Infant oral assessment: Variance Infant oral assessment comment: Infant with thick labial frenulum that inserts at the bottom of the gum ridge. Upper lip is tight with flanging and needs flanging on the breast. Tongue with good mobility, suction and tongue cupping. Infant opens wide to latch to the breast,  needing some upper and lower lip flanging with feeding.  Positioning: Armed forces logistics/support/administrative officer: 1 - Repeated attempts needed to sustain latch, nipple held in mouth throughout feeding, stimulation needed to elicit sucking reflex. Audible swallowing: 1 - A few with stimulation Type of nipple: 2 - Everted at rest and after stimulation Comfort: 2 - Soft/non-tender Hold: 2 - No assistance needed to correctly position infant at breast LATCH score: 8 Latch  assessment: Deep Lips flanged: No Suck assessment: Displays both Tools: Syringe with 5 Fr feeding tube Pre-feed weight: 3594 grams Post feed weight: 3606 grams Amount transferred: 3 ml Amount supplemented: 9 ml  Additional Feeding Assessment: Infant oral assessment: Variance                                  Totals: Total amount transferred: 3 ml Total supplement given: 56 Total amount pumped post feeding: 5.5 ounces  1. Offer the breast with each feeding as mom and infant desire, feed infant with feeding cues 2. Can use the 5 french feeding tube at the breast with breast milk in it if you want to 3. Keep infant awake at the breast as needed 4. Massage/compress breast with feeding 5. Consider using a Dr Saul Fordyce Level 1 nipple for feedings for slower flow nipple 6. Use the PACE bottle feeding method ([email or web address]) 7. Continue pumping 8 x a day to empty breasts 8. Try using ice to engorged areas before pumping and heat can be applied afterwards, use ice for 24-48 hours and then stop 9. Christina Charles Kitchen needs 66-88 ml (2-3 ounces) or 525-700 ml(18-23 ounces) a day 10. Keep up with good work 83. Thank you for allowing me to assist you today 12. Call with any questions/concerns as needed [phone] 13. Follow up with Lactation as needed   Donn Pierini RN, IBCLC                                                        Donn Pierini 05/04/2018, 2:34 PM

## 2018-06-11 DIAGNOSIS — Z30430 Encounter for insertion of intrauterine contraceptive device: Secondary | ICD-10-CM | POA: Diagnosis not present

## 2018-06-11 DIAGNOSIS — Z3202 Encounter for pregnancy test, result negative: Secondary | ICD-10-CM | POA: Diagnosis not present

## 2018-07-07 DIAGNOSIS — T8332XA Displacement of intrauterine contraceptive device, initial encounter: Secondary | ICD-10-CM | POA: Diagnosis not present

## 2018-07-15 DIAGNOSIS — T8332XA Displacement of intrauterine contraceptive device, initial encounter: Secondary | ICD-10-CM | POA: Diagnosis not present

## 2018-08-08 DIAGNOSIS — S6722XA Crushing injury of left hand, initial encounter: Secondary | ICD-10-CM | POA: Diagnosis not present

## 2018-08-08 DIAGNOSIS — S60222A Contusion of left hand, initial encounter: Secondary | ICD-10-CM | POA: Diagnosis not present

## 2018-09-01 DIAGNOSIS — K080 Exfoliation of teeth due to systemic causes: Secondary | ICD-10-CM | POA: Diagnosis not present

## 2018-10-06 DIAGNOSIS — K080 Exfoliation of teeth due to systemic causes: Secondary | ICD-10-CM | POA: Diagnosis not present

## 2018-11-20 DIAGNOSIS — D2272 Melanocytic nevi of left lower limb, including hip: Secondary | ICD-10-CM | POA: Diagnosis not present

## 2018-11-20 DIAGNOSIS — L821 Other seborrheic keratosis: Secondary | ICD-10-CM | POA: Diagnosis not present

## 2018-11-20 DIAGNOSIS — D2262 Melanocytic nevi of left upper limb, including shoulder: Secondary | ICD-10-CM | POA: Diagnosis not present

## 2018-11-20 DIAGNOSIS — D225 Melanocytic nevi of trunk: Secondary | ICD-10-CM | POA: Diagnosis not present

## 2018-11-20 DIAGNOSIS — D485 Neoplasm of uncertain behavior of skin: Secondary | ICD-10-CM | POA: Diagnosis not present

## 2019-04-29 ENCOUNTER — Ambulatory Visit (INDEPENDENT_AMBULATORY_CARE_PROVIDER_SITE_OTHER): Payer: Federal, State, Local not specified - PPO

## 2019-04-29 ENCOUNTER — Encounter: Payer: Self-pay | Admitting: Podiatry

## 2019-04-29 ENCOUNTER — Ambulatory Visit: Payer: Federal, State, Local not specified - PPO | Admitting: Podiatry

## 2019-04-29 ENCOUNTER — Other Ambulatory Visit: Payer: Self-pay

## 2019-04-29 VITALS — BP 128/75 | Temp 98.2°F

## 2019-04-29 DIAGNOSIS — M722 Plantar fascial fibromatosis: Secondary | ICD-10-CM

## 2019-04-29 MED ORDER — MELOXICAM 15 MG PO TABS: 15.0000 mg | ORAL_TABLET | Freq: Every day | ORAL | 3 refills | Status: DC

## 2019-04-29 MED ORDER — METHYLPREDNISOLONE 4 MG PO TBPK: ORAL_TABLET | ORAL | 0 refills | Status: DC

## 2019-04-29 NOTE — Progress Notes (Signed)
Subjective:  Patient ID: Christina Charles, female    DOB: Oct 21, 1976,  MRN: [id number] HPI Chief Complaint  Patient presents with  . Foot Pain    left heel pain, back of the left heel, pain has been going on for about 2 weeks ago, pt states that she is experiencing a burning pain, pt has tried walking ibuprofen and resting but nothing else has been working    43 y.o. female presents with the above complaint.   ROS: Denies fever chills nausea vomiting muscle aches pains calf pain back pain chest pain shortness of breath.  Past Medical History:  Diagnosis Date  . GERD (gastroesophageal reflux disease)   . Hx of varicella   . Vaginal Pap smear, abnormal    Past Surgical History:  Procedure Laterality Date  . NO PAST SURGERIES      Current Outpatient Medications:  .  ibuprofen (ADVIL,MOTRIN) 600 MG tablet, Take 1 tablet (600 mg total) by mouth every 6 (six) hours., Disp: 30 tablet, Rfl: 0 .  ketotifen (ZADITOR) 0.025 % ophthalmic solution, Place 1 drop into both eyes 2 (two) times daily as needed (itchy eyes). , Disp: , Rfl:  .  Prenatal Vit-Fe Fumarate-FA (PRENATAL VITAMIN PO), Take 1 tablet by mouth daily. , Disp: , Rfl:  .  ranitidine (ZANTAC) 150 MG tablet, Take 150 mg by mouth 2 (two) times daily., Disp: , Rfl: 2 .  meloxicam (MOBIC) 15 MG tablet, Take 1 tablet (15 mg total) by mouth daily., Disp: 30 tablet, Rfl: 3 .  methylPREDNISolone (MEDROL DOSEPAK) 4 MG TBPK tablet, 6 day dose pack - take as directed, Disp: 21 tablet, Rfl: 0  No Known Allergies Review of Systems Objective:   Vitals:   04/29/19 1513  BP: 128/75  Temp: 98.2 F (36.8 C)    General: Well developed, nourished, in no acute distress, alert and oriented x3   Dermatological: Skin is warm, dry and supple bilateral. Nails x 10 are well maintained; remaining integument appears unremarkable at this time. There are no open sores, no preulcerative lesions, no rash or signs of infection present.  Vascular:  Dorsalis Pedis artery and Posterior Tibial artery pedal pulses are 2/4 bilateral with immedate capillary fill time. Pedal hair growth present. No varicosities and no lower extremity edema present bilateral.   Neruologic: Grossly intact via light touch bilateral. Vibratory intact via tuning fork bilateral. Protective threshold with Semmes Wienstein monofilament intact to all pedal sites bilateral. Patellar and Achilles deep tendon reflexes 2+ bilateral. No Babinski or clonus noted bilateral.   Musculoskeletal: No gross boney pedal deformities bilateral. No pain, crepitus, or limitation noted with foot and ankle range of motion bilateral. Muscular strength 5/5 in all groups tested bilateral.  Gait: Unassisted, Nonantalgic.    Radiographs:  Radiographs taken today demonstrate a moderate pes planus with soft tissue increase in density plantar fashion calcaneal insertion site of the left heel.  Assessment & Plan:   Assessment: Plantar fasciitis left heel.  Plan: Discussed etiology pathology conservative surgical therapies after sterile Betadine skin prep I injected 20 mg Kenalog 5 mg Marcaine point of maximal tenderness of her left heel medial aspect of the plantar fascial pain calcaneal insertion site.  She tolerated procedure well.  Patient a plantar fascial brace to be followed by a night splint.  Start her on a Medrol Dosepak to be followed by meloxicam.  Follow-up with her in 1 month.  Discussed appropriate shoe gear stretching exercises ice therapy and shoe gear modifications.  Garrel Ridgel, DPM

## 2019-04-29 NOTE — Patient Instructions (Signed)

## 2019-05-27 ENCOUNTER — Encounter: Payer: Self-pay | Admitting: Podiatry

## 2019-05-27 ENCOUNTER — Other Ambulatory Visit: Payer: Self-pay

## 2019-05-27 ENCOUNTER — Ambulatory Visit: Payer: Federal, State, Local not specified - PPO | Admitting: Podiatry

## 2019-05-27 VITALS — Temp 97.8°F

## 2019-05-27 DIAGNOSIS — M722 Plantar fascial fibromatosis: Secondary | ICD-10-CM | POA: Diagnosis not present

## 2019-05-27 NOTE — Progress Notes (Signed)
She presents today for follow-up of her plantar fasciitis she states that is about 50% improved and she said some days are better than others.  She continues to take her anti-inflammatory use her plan fascial braces and night splints and appropriate shoe gear.  Objective: Vital signs are stable alert and oriented x3.  Pulses are palpable neurologic sensorium is intact dT reflexes are intact.  Pain on palpation medial calcaneal tubercle of the left heel.  Assessment: Plantar fasciitis residual 50% left.  Plan: At this point I went ahead and performed another injection 20 mg Kenalog 5 mg Marcaine point maximal tenderness.  Continue the use of her nonsteroidal plantar fascial braces and night splint and tennis shoes.

## 2019-07-01 ENCOUNTER — Other Ambulatory Visit: Payer: Self-pay

## 2019-07-01 ENCOUNTER — Ambulatory Visit: Payer: Federal, State, Local not specified - PPO | Admitting: Podiatry

## 2019-07-01 VITALS — Temp 98.8°F

## 2019-07-01 DIAGNOSIS — M722 Plantar fascial fibromatosis: Secondary | ICD-10-CM | POA: Diagnosis not present

## 2019-07-03 ENCOUNTER — Encounter: Payer: Self-pay | Admitting: Podiatry

## 2019-07-03 NOTE — Progress Notes (Signed)
She presents today for her follow-up of her left foot 1 month states that is doing much better.  She states that she is still wearing the night splint.  She states that she is about 95% better with shoes 75% better without shoes  Objective: Vital signs are stable she is alert and oriented x3.  Pulses are palpable.  She has no pain on palpation medial calcaneal tubercles bilateral.  Assessment: Resolving plantar fasciitis.  Plan: Continue all conservative therapies including plantar fascial brace night splint oral therapy.

## 2019-08-17 DIAGNOSIS — M722 Plantar fascial fibromatosis: Secondary | ICD-10-CM | POA: Diagnosis not present

## 2019-08-17 DIAGNOSIS — Z0000 Encounter for general adult medical examination without abnormal findings: Secondary | ICD-10-CM | POA: Diagnosis not present

## 2019-08-17 DIAGNOSIS — Z130 Encounter for screening for diseases of the blood and blood-forming organs and certain disorders involving the immune mechanism: Secondary | ICD-10-CM | POA: Diagnosis not present

## 2019-08-17 DIAGNOSIS — Z13228 Encounter for screening for other metabolic disorders: Secondary | ICD-10-CM | POA: Diagnosis not present

## 2019-08-17 DIAGNOSIS — Z13220 Encounter for screening for lipoid disorders: Secondary | ICD-10-CM | POA: Diagnosis not present

## 2019-08-24 ENCOUNTER — Other Ambulatory Visit: Payer: Self-pay | Admitting: Podiatry

## 2019-09-06 DIAGNOSIS — Z1151 Encounter for screening for human papillomavirus (HPV): Secondary | ICD-10-CM | POA: Diagnosis not present

## 2019-09-06 DIAGNOSIS — Z23 Encounter for immunization: Secondary | ICD-10-CM | POA: Diagnosis not present

## 2019-09-06 DIAGNOSIS — Z01419 Encounter for gynecological examination (general) (routine) without abnormal findings: Secondary | ICD-10-CM | POA: Diagnosis not present

## 2019-09-06 DIAGNOSIS — Z6835 Body mass index (BMI) 35.0-35.9, adult: Secondary | ICD-10-CM | POA: Diagnosis not present

## 2019-09-06 DIAGNOSIS — Z1231 Encounter for screening mammogram for malignant neoplasm of breast: Secondary | ICD-10-CM | POA: Diagnosis not present

## 2019-12-07 DIAGNOSIS — L821 Other seborrheic keratosis: Secondary | ICD-10-CM | POA: Diagnosis not present

## 2019-12-07 DIAGNOSIS — L820 Inflamed seborrheic keratosis: Secondary | ICD-10-CM | POA: Diagnosis not present

## 2019-12-07 DIAGNOSIS — D2271 Melanocytic nevi of right lower limb, including hip: Secondary | ICD-10-CM | POA: Diagnosis not present

## 2019-12-07 DIAGNOSIS — D1801 Hemangioma of skin and subcutaneous tissue: Secondary | ICD-10-CM | POA: Diagnosis not present

## 2019-12-07 DIAGNOSIS — L812 Freckles: Secondary | ICD-10-CM | POA: Diagnosis not present

## 2019-12-07 DIAGNOSIS — D2262 Melanocytic nevi of left upper limb, including shoulder: Secondary | ICD-10-CM | POA: Diagnosis not present

## 2020-04-03 ENCOUNTER — Other Ambulatory Visit: Payer: Self-pay | Admitting: Podiatry
